# Patient Record
Sex: Male | Born: 1940 | Race: Black or African American | Hispanic: No | Marital: Married | Smoking: Never smoker
Health system: Southern US, Community
[De-identification: ages and names within clinical notes are randomized; demographics above are authoritative.]

## PROBLEM LIST (undated history)

## (undated) DIAGNOSIS — N401 Enlarged prostate with lower urinary tract symptoms: Secondary | ICD-10-CM

## (undated) DIAGNOSIS — E039 Hypothyroidism, unspecified: Secondary | ICD-10-CM

## (undated) DIAGNOSIS — N138 Other obstructive and reflux uropathy: Secondary | ICD-10-CM

## (undated) DIAGNOSIS — N390 Urinary tract infection, site not specified: Secondary | ICD-10-CM

## (undated) DIAGNOSIS — R7989 Other specified abnormal findings of blood chemistry: Secondary | ICD-10-CM

## (undated) DIAGNOSIS — E7800 Pure hypercholesterolemia, unspecified: Secondary | ICD-10-CM

## (undated) DIAGNOSIS — N529 Male erectile dysfunction, unspecified: Secondary | ICD-10-CM

## (undated) DIAGNOSIS — I10 Essential (primary) hypertension: Secondary | ICD-10-CM

## (undated) DIAGNOSIS — K649 Unspecified hemorrhoids: Secondary | ICD-10-CM

## (undated) HISTORY — DX: Other specified abnormal findings of blood chemistry: R79.89

## (undated) HISTORY — DX: Unspecified hemorrhoids: K64.9

## (undated) HISTORY — DX: Essential (primary) hypertension: I10

## (undated) HISTORY — DX: Male erectile dysfunction, unspecified: N52.9

## (undated) HISTORY — DX: Hypothyroidism, unspecified: E03.9

## (undated) HISTORY — DX: Other obstructive and reflux uropathy: N13.8

## (undated) HISTORY — DX: Pure hypercholesterolemia, unspecified: E78.00

## (undated) HISTORY — DX: Urinary tract infection, site not specified: N39.0

## (undated) HISTORY — PX: OTHER SURGICAL HISTORY: SHX169

## (undated) HISTORY — DX: Other obstructive and reflux uropathy: N40.1

---

## 2001-03-14 ENCOUNTER — Ambulatory Visit (HOSPITAL_COMMUNITY): Admission: RE | Admit: 2001-03-14 | Discharge: 2001-03-14 | Payer: Self-pay | Admitting: Gastroenterology

## 2006-10-13 ENCOUNTER — Encounter: Admission: RE | Admit: 2006-10-13 | Discharge: 2006-10-13 | Payer: Self-pay | Admitting: Internal Medicine

## 2008-10-03 ENCOUNTER — Encounter: Admission: RE | Admit: 2008-10-03 | Discharge: 2008-10-03 | Payer: Self-pay | Admitting: Internal Medicine

## 2009-01-31 ENCOUNTER — Encounter: Admission: RE | Admit: 2009-01-31 | Discharge: 2009-01-31 | Payer: Self-pay | Admitting: Internal Medicine

## 2010-05-07 ENCOUNTER — Emergency Department (HOSPITAL_BASED_OUTPATIENT_CLINIC_OR_DEPARTMENT_OTHER): Admission: EM | Admit: 2010-05-07 | Discharge: 2010-05-07 | Payer: Self-pay | Admitting: Emergency Medicine

## 2010-11-11 ENCOUNTER — Encounter: Payer: Self-pay | Admitting: Internal Medicine

## 2010-12-11 NOTE — Procedures (Signed)
[facility]  Patient:    Jake Clarke, Jake Clarke Visit Number: [id number] MRN: [id number]          Service Type: END Location: ENDO Attending Physician:  Louie Bun Proc. Date: 03/14/01 Adm. Date:  03/14/2001   CC:         Eric L. August Saucer, M.D.   Procedure Report  PROCEDURE:  Colonoscopy.  INDICATION FOR PROCEDURE:  Screening colonoscopy in a 70 year old patient with no previous colon screening.  DESCRIPTION OF PROCEDURE:  The patient was placed in the left lateral decubitus position and placed on the pulse monitor with continuous low-flow oxygen delivered by nasal cannula.  He was sedated with 60 mg IV Demerol and 6 mg IV Versed.  The Olympus video colonoscope was inserted into the rectum and advanced to the cecum, confirmed by transillumination at McBurneys point and visualization of the ileocecal valve and appendiceal orifice.  The prep was excellent.  The cecum, ascending, transverse, descending, and sigmoid colon all appeared normal with no masses, polyps, diverticula, or other mucosal abnormalities.  The rectum likewise appeared normal, and retroflex view of the anus revealed small internal hemorrhoids.  The colonoscope was then withdrawn, and the patient returned to the recovery room in stable condition.  He tolerated the procedure well, and there were no immediate complications.  IMPRESSION:  Internal hemorrhoids, otherwise normal colonoscopy. Attending Physician:  Louie Bun DD:  03/14/01 TD:  03/14/01 Job: [id number]

## 2012-02-29 LAB — BASIC METABOLIC PANEL
Creatinine: 1.2 mg/dL (ref 0.6–1.3)
Glucose: 107 mg/dL

## 2012-02-29 LAB — HEPATIC FUNCTION PANEL: ALT: 17 U/L (ref 10–40)

## 2012-05-04 ENCOUNTER — Encounter: Payer: Self-pay | Admitting: Family Medicine

## 2012-05-04 ENCOUNTER — Other Ambulatory Visit: Payer: Self-pay | Admitting: Family Medicine

## 2012-05-04 ENCOUNTER — Ambulatory Visit (INDEPENDENT_AMBULATORY_CARE_PROVIDER_SITE_OTHER): Payer: Medicare Other | Admitting: Family Medicine

## 2012-05-04 VITALS — BP 145/88 | HR 66 | Ht 72.0 in | Wt 211.0 lb

## 2012-05-04 DIAGNOSIS — N139 Obstructive and reflux uropathy, unspecified: Secondary | ICD-10-CM

## 2012-05-04 DIAGNOSIS — E039 Hypothyroidism, unspecified: Secondary | ICD-10-CM

## 2012-05-04 DIAGNOSIS — N138 Other obstructive and reflux uropathy: Secondary | ICD-10-CM

## 2012-05-04 DIAGNOSIS — Z23 Encounter for immunization: Secondary | ICD-10-CM | POA: Insufficient documentation

## 2012-05-04 DIAGNOSIS — E785 Hyperlipidemia, unspecified: Secondary | ICD-10-CM | POA: Insufficient documentation

## 2012-05-04 NOTE — Patient Instructions (Signed)
Benign Prostatic Hypertrophy  The prostate gland is part of the reproductive system of men. A normal prostate is about the size and shape of a walnut. The prostate gland makes a fluid that is mixed with sperm to make semen. This gland surrounds the urethra and is located in front of the rectum and just below the bladder. The bladder is where urine is stored. The urethra is the tube through which urine passes from the bladder to get out of the body. The prostate grows as a man ages. An enlarged prostate not caused by cancer is called benign prostatic hypertrophy (BPH). This is a common health problem in men over age 50. This condition is a normal part of aging. An enlarged prostate presses on the urethra. This makes it harder to pass urine. In the early stages of enlargement, the bladder can get by with a narrowed urethra by forcing the urine through. If the problem gets worse, medical or surgical treatment may be required.  This condition should be followed by your caregiver. Longstanding back pressure on the kidneys can cause infection. Back pressure and infection can progress to bladder damage and kidney (renal) failure. If needed, your caregiver may refer you to a specialist in kidney and prostate disease (urologist). CAUSES  The exact cause is not known.  SYMPTOMS   You are not able to completely empty your bladder.  Getting up often during the night to urinate.  Need to urinate frequently during the day.  Difficultly in starting urine flow.  Decrease in size and strength of the urine stream.  Dribbling after urination.  Pain on urination (more common with infection).  Inability to pass your water. This needs immediate treatment. DIAGNOSIS  These tests will help your caregiver understand your problem:  Digital rectal exam (DRE). In a rectal exam, your caregiver checks your prostate by putting a gloved, lubricated finger into the rectum to feel the back of your prostate gland. This exam  detects the size of the gland and abnormal lumps or growths.  Urinalysis (exam of the urine). This may include a culture if there is concern about infection.  Prostate Specific Antigen (PSA). This is a blood test used to screen for prostate cancer. It is not used alone for diagnosing prostate cancer.  Rectal ultrasound (sonogram). This test uses sound waves to electronically produce a "picture" of the prostate. It helps examine the prostate gland for cancer. TREATMENT  Mild symptoms may not need treatment. Simple observation and yearly exams may be all that is required. Medications and surgery are options for more severe problems. Your caregiver can help you make an informed decision for what is best. Two classes of medications are available for relief of prostate symptoms:  Medications that shrink the prostate. This helps relieve symptoms.  Uncommon side effects include problems with sexual function.  Medications to relax the muscle of the prostate. This also relieves the obstruction.  Side effects can include dizziness, fatigue, lightheadedness, and retrograde ejaculation (diminished volume of ejaculate). Several types of surgical treatments are available for relief of prostate symptoms:  Transurethral resection of the prostate (TURP). In this treatment, an instrument is inserted through opening at the tip of the penis. It is used to cut away pieces of the inner core of the prostate. The pieces are removed through the same opening of the penis. This removes the obstruction and helps get rid of the symptoms.  Transurethral incision (TUIP). In this procedure, small cuts are made in the prostate. This   lessens the prostates pressure on the urethra.  Transurethral microwave thermotherapy (TUMT). This procedure uses microwaves to create heat. The heat destroys and removes a small amount of prostate tissue.  Transurethral needle ablation (TUNA). This is a procedure that uses radio frequencies to  do the same as TUMT.  Interstitial laser coagulation (ILC). This is a procedure that uses a laser to do the same as TUMT and TUNA.  Transurethral electrovaporization (TUVP). This is a procedure that uses electrodes to do the same as the procedures listed above. Regardless of the method of treatment chosen, you and your caregiver will discuss the options. With this knowledge, you along with your caregiver can decide upon the best treatment for you. SEEK MEDICAL CARE IF:   You develop chills, fever of 100.5 F (38.1 C), or night sweats.  There is unexplained back pain.  Symptoms are not helped by medications prescribed.  You develop medication side effects.  Your urine becomes very dark or has a bad smell. SEEK IMMEDIATE MEDICAL CARE IF:   You are suddenly unable to urinate. This is an emergency. You should be seen immediately.  There are large amounts of blood or clots in the urine.  Your urinary problems become unmanageable.  You develop lightheadedness, severe dizziness, or you feel faint.  You develop moderate to severe low back or flank pain.  You develop chills or fever. Document Released: 07/12/2005 Document Revised: 10/04/2011 Document Reviewed: 04/03/2007 [employer] Patient Information 2013 [employer].  

## 2012-05-04 NOTE — Progress Notes (Signed)
  Subjective:    Patient ID: Jake Clarke, male    DOB: 14-Mar-1941, 71 y.o.   MRN: [id number]  HPI #1 patient's here to establish care. #2 hypothyroidism he is taking Synthroid 50 mcg for hypothyroidism #3 hypertension. He has a history of elevated blood pressure but since she's been exercising more active and walking his blood pressures come under control. Currently is not taking any medication for his elevated blood pressure. #4 prostatic hypertrophy with some obstruction. He is taking both Flomax and Cialis low-dose which is helped his prostatic hypertrophy but lately he has noticed starting to have nocturia again 2-3 times at night and would be best be described as being bladder instability during the day we feels that he has to go but unable to into his bladder well at the time. Unfortunately we do not have when his last PSA was done so we'll probably need to get some records from his doctors office. #5 immunization update. He had his pneumonia shot last year we will need a flu shot this year.  Review of Systems  All other systems reviewed and are negative.   BP 145/88  Pulse 66  Ht 6' (1.829 m)  Wt 211 lb (95.709 kg)  BMI 28.62 kg/m2  SpO2 96% No Known Allergies History   Social History  . Marital Status: Single    Spouse Name: N/A    Number of Children: N/A  . Years of Education: N/A   Occupational History  . Not on file.   Social History Main Topics  . Smoking status: Never Smoker   . Smokeless tobacco: Former Neurosurgeon  . Alcohol Use: No  . Drug Use: No  . Sexually Active: Yes   Other Topics Concern  . Not on file   Social History Narrative  . No narrative on file   Family History  Problem Relation Age of Onset  . Kidney disease Father   . Heart disease Sister    Past Medical History  Diagnosis Date  . Essential hypertension, malignant   . Pure hypercholesterolemia   . Other abnormal blood chemistry   . Unspecified hypothyroidism   . Impotence of organic  origin   . Urinary tract infection, site not specified   . Unspecified hemorrhoids without mention of complication   . Prostatic hypertrophy, benign, with obstruction    History reviewed. No pertinent past surgical history.     BP 145/88  Pulse 66  Ht 6' (1.829 m)  Wt 211 lb (95.709 kg)  BMI 28.62 kg/m2  SpO2 96% Objective:   Physical Exam  Constitutional: He is oriented to person, place, and time. He appears well-developed and well-nourished.  HENT:  Head: Normocephalic and atraumatic.  Neck: Normal range of motion.  Cardiovascular: Normal rate.   Neurological: He is alert and oriented to person, place, and time.  Psychiatric: He has a normal mood and affect. His behavior is normal.       Assessment & Plan:  #1 hypertension will continue to watch that blood pressure. At this time no new medication.  #2 hypothyroidism. Also we'll await to see lab work before we make any changes with that.  #3 prostatic hypertrophy. We will continue course with the Cialis and the Flomax. Once we have an idea of when his last prostate examination was done and PSA we'll go from there   #4 immunization update. We'll give flu shot today   Will have him return in 2 months for followup.

## 2012-06-15 ENCOUNTER — Encounter: Payer: Self-pay | Admitting: Family Medicine

## 2012-06-15 ENCOUNTER — Ambulatory Visit (INDEPENDENT_AMBULATORY_CARE_PROVIDER_SITE_OTHER): Payer: Medicare Other | Admitting: Family Medicine

## 2012-06-15 VITALS — BP 135/76 | HR 74 | Temp 98.0°F | Ht 72.0 in | Wt 213.0 lb

## 2012-06-15 DIAGNOSIS — N138 Other obstructive and reflux uropathy: Secondary | ICD-10-CM

## 2012-06-15 DIAGNOSIS — N139 Obstructive and reflux uropathy, unspecified: Secondary | ICD-10-CM

## 2012-06-15 DIAGNOSIS — N400 Enlarged prostate without lower urinary tract symptoms: Secondary | ICD-10-CM

## 2012-06-15 DIAGNOSIS — Z1211 Encounter for screening for malignant neoplasm of colon: Secondary | ICD-10-CM

## 2012-06-15 LAB — POCT URINALYSIS DIPSTICK
Bilirubin, UA: NEGATIVE
Blood, UA: NEGATIVE
Nitrite, UA: NEGATIVE
pH, UA: 6

## 2012-06-15 LAB — HEMOCCULT GUIAC POC 1CARD (OFFICE)

## 2012-06-15 MED ORDER — TADALAFIL 5 MG PO TABS: 5.0000 mg | ORAL_TABLET | Freq: Every day | ORAL | Status: DC

## 2012-06-15 MED ORDER — TAMSULOSIN HCL 0.4 MG PO CAPS: 0.4000 mg | ORAL_CAPSULE | Freq: Every day | ORAL | Status: DC

## 2012-06-15 MED ORDER — TADALAFIL 5 MG PO TABS: 30.0000 mg | ORAL_TABLET | Freq: Every day | ORAL | Status: DC

## 2012-06-15 MED ORDER — CIPROFLOXACIN HCL 500 MG PO TABS: 500.0000 mg | ORAL_TABLET | Freq: Two times a day (BID) | ORAL | Status: AC

## 2012-06-15 NOTE — Progress Notes (Signed)
Subjective:    Patient ID: Jake Clarke, male    DOB: 07-Jun-1941, 71 y.o.   MRN: [id number]  HPI #1 prostatic hypertrophy. Patient reports difficulty going to the bathroom frequency at night and feeling as if his stream is very weak. He normally takes Cialis and Flomax. This is been going on for about 3-4 weeks.  #2 ringing in his ears. He reports about 4 weeks ago having ringing in his ears so he stopped his Cialis but has noticed the ringing has continued. Worse at night.   #3 sexual dysfunction. Reason why he stopped the Cialis was that he felt the Cialis no longer help him sexually. Reports initially he was placed on Cialis it helped but lately it doesn't seem to work any more.  Review of Systems  Genitourinary: Positive for urgency, decreased urine volume and difficulty urinating.       Sexual dysfunction  All other systems reviewed and are negative.      BP 135/76  Pulse 74  Temp 98 F (36.7 C) (Oral)  Ht 6' (1.829 m)  Wt 213 lb (96.616 kg)  BMI 28.89 kg/m2 Objective:   Physical Exam  Vitals reviewed. Constitutional: He appears well-developed and well-nourished.  HENT:  Head: Normocephalic.  Right Ear: Hearing, tympanic membrane, external ear and ear canal normal.  Left Ear: Hearing, tympanic membrane, external ear and ear canal normal.  Eyes: Pupils are equal, round, and reactive to light.  Neck: Neck supple.  Genitourinary: Rectum normal and penis normal. Rectal exam shows no external hemorrhoid, no internal hemorrhoid, no fissure, no mass and no tenderness. Guaiac negative stool. Prostate is enlarged. Prostate is not tender. Circumcised.       Results for orders placed in visit on 06/15/12  POCT URINALYSIS DIPSTICK      Component Value Range   Color, UA yellow     Clarity, UA clear     Glucose, UA neg     Bilirubin, UA neg     Ketones, UA neg     Spec Grav, UA 1.020     Blood, UA neg     pH, UA 6.0     Protein, UA 30     Urobilinogen, UA 0.2     Nitrite, UA neg     Leukocytes, UA Negative     Assessment & Plan:  #1 prostatic enlargement. Have patient restart his Cialis 5 mg one tablet a day. Continue with his Flomax as well. Suggested to him that when he has a yearly exam in about 4 months recheck his prostate then also obtain a PSA when he has his blood work. Stressed to him that the Cialis and Flonase work by 2 different mechanisms to keep his prostate under control. While no signs of active infection was found I will place him on Cipro 500 mg twice a day for 2 weeks.  #2 sexual dysfunction. Explained to him that 5 mg Cialis is relatively low dose. I will give him a voucher for free 30 pills that he needs to get filled at a different pharmacy without using his insurance, and another 30 tablets sample pack given to patient as well. Went over several times that when he wants to become active sexually he didn't takes 2-3 tablets total of Cialis that day. On all other days he just takes one tablet 5 mg. The extra Cialis given to him we'll keep him from running out at the end of the month.  #3 ringing in his ears. Recommend  he listens to a radio low-volume at night to mask the ringing in his ears which I think is from peripheral vascular disease.

## 2012-06-15 NOTE — Patient Instructions (Signed)
Benign Prostatic Hypertrophy  The prostate gland is part of the reproductive system of men. A normal prostate is about the size and shape of a walnut. The prostate gland makes a fluid that is mixed with sperm to make semen. This gland surrounds the urethra and is located in front of the rectum and just below the bladder. The bladder is where urine is stored. The urethra is the tube through which urine passes from the bladder to get out of the body. The prostate grows as a man ages. An enlarged prostate not caused by cancer is called benign prostatic hypertrophy (BPH). This is a common health problem in men over age 50. This condition is a normal part of aging. An enlarged prostate presses on the urethra. This makes it harder to pass urine. In the early stages of enlargement, the bladder can get by with a narrowed urethra by forcing the urine through. If the problem gets worse, medical or surgical treatment may be required.  This condition should be followed by your caregiver. Longstanding back pressure on the kidneys can cause infection. Back pressure and infection can progress to bladder damage and kidney (renal) failure. If needed, your caregiver may refer you to a specialist in kidney and prostate disease (urologist). CAUSES  The exact cause is not known.  SYMPTOMS   You are not able to completely empty your bladder.  Getting up often during the night to urinate.  Need to urinate frequently during the day.  Difficultly in starting urine flow.  Decrease in size and strength of the urine stream.  Dribbling after urination.  Pain on urination (more common with infection).  Inability to pass your water. This needs immediate treatment. DIAGNOSIS  These tests will help your caregiver understand your problem:  Digital rectal exam (DRE). In a rectal exam, your caregiver checks your prostate by putting a gloved, lubricated finger into the rectum to feel the back of your prostate gland. This exam  detects the size of the gland and abnormal lumps or growths.  Urinalysis (exam of the urine). This may include a culture if there is concern about infection.  Prostate Specific Antigen (PSA). This is a blood test used to screen for prostate cancer. It is not used alone for diagnosing prostate cancer.  Rectal ultrasound (sonogram). This test uses sound waves to electronically produce a "picture" of the prostate. It helps examine the prostate gland for cancer. TREATMENT  Mild symptoms may not need treatment. Simple observation and yearly exams may be all that is required. Medications and surgery are options for more severe problems. Your caregiver can help you make an informed decision for what is best. Two classes of medications are available for relief of prostate symptoms:  Medications that shrink the prostate. This helps relieve symptoms.  Uncommon side effects include problems with sexual function.  Medications to relax the muscle of the prostate. This also relieves the obstruction.  Side effects can include dizziness, fatigue, lightheadedness, and retrograde ejaculation (diminished volume of ejaculate). Several types of surgical treatments are available for relief of prostate symptoms:  Transurethral resection of the prostate (TURP). In this treatment, an instrument is inserted through opening at the tip of the penis. It is used to cut away pieces of the inner core of the prostate. The pieces are removed through the same opening of the penis. This removes the obstruction and helps get rid of the symptoms.  Transurethral incision (TUIP). In this procedure, small cuts are made in the prostate. This   lessens the prostates pressure on the urethra.  Transurethral microwave thermotherapy (TUMT). This procedure uses microwaves to create heat. The heat destroys and removes a small amount of prostate tissue.  Transurethral needle ablation (TUNA). This is a procedure that uses radio frequencies to  do the same as TUMT.  Interstitial laser coagulation (ILC). This is a procedure that uses a laser to do the same as TUMT and TUNA.  Transurethral electrovaporization (TUVP). This is a procedure that uses electrodes to do the same as the procedures listed above. Regardless of the method of treatment chosen, you and your caregiver will discuss the options. With this knowledge, you along with your caregiver can decide upon the best treatment for you. SEEK MEDICAL CARE IF:   You develop chills, fever of 100.5 F (38.1 C), or night sweats.  There is unexplained back pain.  Symptoms are not helped by medications prescribed.  You develop medication side effects.  Your urine becomes very dark or has a bad smell. SEEK IMMEDIATE MEDICAL CARE IF:   You are suddenly unable to urinate. This is an emergency. You should be seen immediately.  There are large amounts of blood or clots in the urine.  Your urinary problems become unmanageable.  You develop lightheadedness, severe dizziness, or you feel faint.  You develop moderate to severe low back or flank pain.  You develop chills or fever. Document Released: 07/12/2005 Document Revised: 10/04/2011 Document Reviewed: 04/03/2007 [facility] Patient Information 2013 [place], [place]. Tinnitus Sounds you hear in your ears and coming from within the ear is called tinnitus. This can be a symptom of many ear disorders. It is often associated with hearing loss.  Tinnitus can be seen with:  Infections.  Ear blockages such as wax buildup.  Meniere's disease.  Ear damage.  Inherited.  Occupational causes. While irritating, it is not usually a threat to health. When the cause of the tinnitus is wax, infection in the middle ear, or foreign body it is easily treated. Hearing loss will usually be reversible.  TREATMENT  When treating the underlying cause does not get rid of tinnitus, it may be necessary to get rid of the unwanted sound by covering it  up with more pleasant background noises. This may include music, the radio etc. There are tinnitus maskers which can be worn which produce background noise to cover up the tinnitus. Avoid all medications which tend to make tinnitus worse such as alcohol, caffeine, aspirin, and nicotine. There are many soothing background tapes such as rain, ocean, thunderstorms, etc. These soothing sounds help with sleeping or resting. Keep all follow-up appointments and referrals. This is important to identify the cause of the problem. It also helps avoid complications, impaired hearing, disability, or chronic pain. Document Released: 07/12/2005 Document Revised: 10/04/2011 Document Reviewed: 02/28/2008 [facility] Patient Information 2013 [place], [place].

## 2012-06-23 ENCOUNTER — Other Ambulatory Visit: Payer: Self-pay | Admitting: Family Medicine

## 2012-06-26 ENCOUNTER — Telehealth: Payer: Self-pay | Admitting: Family Medicine

## 2012-06-26 ENCOUNTER — Other Ambulatory Visit: Payer: Self-pay | Admitting: Family Medicine

## 2012-06-26 DIAGNOSIS — E785 Hyperlipidemia, unspecified: Secondary | ICD-10-CM

## 2012-06-26 NOTE — Telephone Encounter (Signed)
See refill note from today. 

## 2012-06-26 NOTE — Telephone Encounter (Signed)
Sue Lush, Will you please let Jake Clarke know that I sent in a 30 day supply of his atorvastatin/Lipitor, I don't see any record of a recent lipid panel and would like to have that checked before providing long term refills.  I'd like him to be fasting for this, I'll place a lab order up front for him to do this at his convenience.  There was a request for azithromycin as well but antibiotic refills require an office visit for an evaluation.  Thank you.

## 2012-06-26 NOTE — Telephone Encounter (Signed)
Left detailed message on vm.

## 2012-07-04 ENCOUNTER — Ambulatory Visit: Payer: Medicare Other | Admitting: Family Medicine

## 2012-07-25 ENCOUNTER — Other Ambulatory Visit: Payer: Self-pay | Admitting: *Deleted

## 2012-07-25 MED ORDER — TADALAFIL 5 MG PO TABS: 5.0000 mg | ORAL_TABLET | Freq: Every day | ORAL | Status: DC

## 2012-08-09 ENCOUNTER — Encounter: Payer: Self-pay | Admitting: Family Medicine

## 2012-08-09 ENCOUNTER — Ambulatory Visit (INDEPENDENT_AMBULATORY_CARE_PROVIDER_SITE_OTHER): Payer: Medicare Other | Admitting: Family Medicine

## 2012-08-09 VITALS — BP 128/75 | HR 61 | Temp 98.0°F | Ht 72.0 in | Wt 211.0 lb

## 2012-08-09 DIAGNOSIS — R3589 Other polyuria: Secondary | ICD-10-CM | POA: Insufficient documentation

## 2012-08-09 DIAGNOSIS — N138 Other obstructive and reflux uropathy: Secondary | ICD-10-CM

## 2012-08-09 DIAGNOSIS — E039 Hypothyroidism, unspecified: Secondary | ICD-10-CM

## 2012-08-09 DIAGNOSIS — N529 Male erectile dysfunction, unspecified: Secondary | ICD-10-CM | POA: Insufficient documentation

## 2012-08-09 DIAGNOSIS — E785 Hyperlipidemia, unspecified: Secondary | ICD-10-CM

## 2012-08-09 DIAGNOSIS — R358 Other polyuria: Secondary | ICD-10-CM

## 2012-08-09 DIAGNOSIS — N401 Enlarged prostate with lower urinary tract symptoms: Secondary | ICD-10-CM

## 2012-08-09 DIAGNOSIS — N139 Obstructive and reflux uropathy, unspecified: Secondary | ICD-10-CM

## 2012-08-09 NOTE — Progress Notes (Signed)
CC: Jake Clarke is a 72 y.o. male is here for Benign Prostatic Hypertrophy and lab work   Subjective: HPI: Patient presents for Medicare wellness exam, scheduling error only allow him 15 minute visit. 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 During the day has urgency and hesitancy. Has had infrequent accidents any urinary sense. Describes symptoms overall as moderate in severity. No worse than before starting daily Cialis. Denies polyphagia polydipsia poorly healing wounds, vision loss, nor motor or sensory disturbances. Denies unintentional weight loss nor family history of prostate cancer. He believes former providers have told him his PSA was mildly elevated. Symptoms above can be present any hour of the day. Restricted water intake improves symptoms, increasing fluid intake worsens  Hypothyroidism: He believes it's been over a year since TSH was checked. Denies missed levothyroxin doses. Denies tremor, anxiety, depression, weight gain or loss, constipation nor diarrhea, nor skin changes  Hyperlipidemia: Currently taking Lipitor daily without missed doses, watches what he, exercises regularly with walking and exercise equipment. Denies right upper quadrant pain, skin or scleral discoloration, nor myalgias  Erectile dysfunction: Complains of inability to initiate and maintain erection in less using Cialis. This is been present for years. It is not getting better or worse, described as moderate in severity. There within a makes worse as not having Cialis available. Denies chest pain, irregular heartbeat, limb claudication, testicular pain/swelling/masses, nor penile discharge   Review Of Systems Outlined In HPI  Past Medical History  Diagnosis Date  . Essential hypertension, malignant   . Pure hypercholesterolemia   . Other abnormal blood chemistry   . Unspecified hypothyroidism   .  Impotence of organic origin   . Urinary tract infection, site not specified   . Unspecified hemorrhoids without mention of complication   . Prostatic hypertrophy, benign, with obstruction      Family History  Problem Relation Age of Onset  . Kidney disease Father   . Heart disease Sister      History  Substance Use Topics  . Smoking status: Never Smoker   . Smokeless tobacco: Former Neurosurgeon  . Alcohol Use: No     Objective: Filed Vitals:   08/09/12 1119  BP: 128/75  Pulse: 61  Temp: 98 F (36.7 C)    General: Alert and Oriented, No Acute Distress HEENT: Pupils equal, round, reactive to light. Conjunctivae clear.  Moist mucous membranes, pharynx without inflammation nor lesions.  Neck supple without palpable lymphadenopathy nor abnormal masses. Nonpalpable thyroid Lungs: Clear to auscultation bilaterally, no wheezing/ronchi/rales.  Comfortable work of breathing. Good air movement. No carotid bruit Cardiac: Regular rate and rhythm. Normal S1/S2.  No murmurs, rubs, nor gallops.   Extremities: No peripheral edema.  Strong peripheral pulses.  Mental Status: No depression, anxiety, nor agitation. Skin: Warm and dry.  Assessment & Plan: Jake Clarke was seen today for benign prostatic hypertrophy and lab work.  Diagnoses and associated orders for this visit:  Hyperlipidemia - Lipid panel  Hypothyroid - TSH  Prostate hyperplasia with urinary obstruction - PSA, Medicare  Erectile dysfunction - Testosterone, free, total  Polyuria - Urinalysis, Routine w reflex microscopic    Hyperlipidemia: Clinically stabl,Overdue for lipid panel will titrate Lipitor as needed Hypothyroidism: Clinically stable: Overdue for TSH and will adjust levothyroxine as needed Prostate hyperplasia with obstruction: Due for PSA, we'll discuss possible use of finasteride if within normal limits, urology  referral if elevated Rectal dysfunction: Sample of Cialis given today, we'll check testosterone for  any possibility of future replacement therapy Polyuria: Likely due to the prostate hypertrophy, will rule out glucose in the urine  Return in about 1 week (around 08/16/2012) for cpe.

## 2012-08-10 ENCOUNTER — Telehealth: Payer: Self-pay | Admitting: Family Medicine

## 2012-08-10 DIAGNOSIS — E785 Hyperlipidemia, unspecified: Secondary | ICD-10-CM

## 2012-08-10 LAB — LIPID PANEL: HDL: 48 mg/dL (ref >39)

## 2012-08-10 LAB — TESTOSTERONE, FREE, TOTAL, SHBG
Sex Hormone Binding: 47 nmol/L (ref 13–71)
Testosterone, Free: 74.6 pg/mL (ref 47.0–244.0)
Testosterone-% Free: 1.6 % (ref 1.6–2.9)
Testosterone: 453.49 ng/dL (ref 300–890)

## 2012-08-10 LAB — URINALYSIS, ROUTINE W REFLEX MICROSCOPIC
Hgb urine dipstick: NEGATIVE
Ketones, ur: NEGATIVE mg/dL
Nitrite: NEGATIVE
Protein, ur: 30 mg/dL — AB
Urobilinogen, UA: 0.2 mg/dL (ref 0.0–1.0)

## 2012-08-10 LAB — PSA, MEDICARE: PSA: 3.83 ng/mL (ref <=4.00)

## 2012-08-10 LAB — URINALYSIS, MICROSCOPIC ONLY: Bacteria, UA: NONE SEEN

## 2012-08-10 LAB — TSH: TSH: 2.169 u[IU]/mL (ref 0.350–4.500)

## 2012-08-10 NOTE — Telephone Encounter (Signed)
Pt notified and voiced understanding 

## 2012-08-10 NOTE — Telephone Encounter (Signed)
Sue Lush, Will you please let Jake Clarke know that there were no alarming values from his blood work yesterday.  I'll go over the specifics when I see him next week at his CPE.  His testosterone level was normal.  His prostate screening test was not elevated which is good news because he is a canididate for starting a medication called finasteride to help with his enlarged prostate symptoms.  Just wanted to give him the name and a chance to do some reading about it online before he sees me next week.  Thank you.

## 2012-08-16 ENCOUNTER — Encounter: Payer: Medicare Other | Admitting: Family Medicine

## 2012-08-17 ENCOUNTER — Encounter: Payer: Self-pay | Admitting: Family Medicine

## 2012-08-17 ENCOUNTER — Ambulatory Visit (INDEPENDENT_AMBULATORY_CARE_PROVIDER_SITE_OTHER): Payer: Medicare Other | Admitting: Family Medicine

## 2012-08-17 VITALS — BP 158/81 | HR 67 | Ht 72.0 in | Wt 213.0 lb

## 2012-08-17 DIAGNOSIS — E785 Hyperlipidemia, unspecified: Secondary | ICD-10-CM

## 2012-08-17 DIAGNOSIS — Z0000 Encounter for general adult medical examination without abnormal findings: Secondary | ICD-10-CM

## 2012-08-17 DIAGNOSIS — N138 Other obstructive and reflux uropathy: Secondary | ICD-10-CM

## 2012-08-17 DIAGNOSIS — E039 Hypothyroidism, unspecified: Secondary | ICD-10-CM

## 2012-08-17 DIAGNOSIS — Z23 Encounter for immunization: Secondary | ICD-10-CM

## 2012-08-17 MED ORDER — TADALAFIL 5 MG PO TABS: ORAL_TABLET | ORAL | Status: DC

## 2012-08-17 MED ORDER — ZOSTER VACCINE LIVE 19400 UNT/0.65ML ~~LOC~~ SOLR: 0.6500 mL | Freq: Once | SUBCUTANEOUS | Status: DC

## 2012-08-17 MED ORDER — ATORVASTATIN CALCIUM 10 MG PO TABS: ORAL_TABLET | ORAL | Status: DC

## 2012-08-17 NOTE — Progress Notes (Addendum)
Subjective:    Jake Clarke is a 72 y.o. male who presents for Medicare Annual/Subsequent preventive examination.   Preventive Screening-Counseling & Management  Tobacco History  Smoking status  . Never Smoker   Smokeless tobacco  . Former Neurosurgeon    Problems Prior to Visit 1. BPH, HLD, ED  Current Problems (verified) Patient Active Problem List  Diagnosis  . Hyperlipidemia LDL goal < 130  . Prostate hyperplasia with urinary obstruction  . Hypothyroid  . Immunization due  . Erectile dysfunction  . Polyuria    Medications Prior to Visit Current Outpatient Prescriptions on File Prior to Visit  Medication Sig Dispense Refill  . atorvastatin (LIPITOR) 10 MG tablet Take one on Monday, Wednesday, and Friday  30 tablet  3  . Calcium Carbonate (CALTRATE 600 PO) Take 600 mg by mouth.        . levothyroxine (SYNTHROID, LEVOTHROID) 50 MCG tablet Take 50 mcg by mouth daily.      . Multiple Vitamins-Minerals (CENTRUM SILVER PO) Take by mouth.        . Tamsulosin HCl (FLOMAX) 0.4 MG CAPS Take 1 capsule (0.4 mg total) by mouth daily.  30 capsule  6    Current Medications (verified) Current Outpatient Prescriptions  Medication Sig Dispense Refill  . atorvastatin (LIPITOR) 10 MG tablet Take one on Monday, Wednesday, and Friday  30 tablet  3  . Calcium Carbonate (CALTRATE 600 PO) Take 600 mg by mouth.        . levothyroxine (SYNTHROID, LEVOTHROID) 50 MCG tablet Take 50 mcg by mouth daily.      . Multiple Vitamins-Minerals (CENTRUM SILVER PO) Take by mouth.        . tadalafil (CIALIS) 5 MG tablet Take one by mouth daily for (symptomatic) BPH with urinary obstruction. (Dx: 600.91, 599.69)  30 tablet  4  . Tamsulosin HCl (FLOMAX) 0.4 MG CAPS Take 1 capsule (0.4 mg total) by mouth daily.  30 capsule  6  . zoster vaccine live, PF, (ZOSTAVAX) 16109 UNT/0.65ML injection Inject 19,400 Units into the skin once.  1 each  0     Allergies (verified) Review of patient's allergies indicates no  known allergies.   PAST HISTORY  Family History Family History  Problem Relation Age of Onset  . Kidney disease Father   . Heart disease Sister     Social History History  Substance Use Topics  . Smoking status: Never Smoker   . Smokeless tobacco: Former Neurosurgeon  . Alcohol Use: No    Are there smokers in your home (other than you)?  No  Risk Factors Current exercise habits: Home exercise routine includes walking 12 miles a week, occasional treadmill.  Dietary issues discussed: low salt, low fat, high fiber   Cardiac risk factors: advanced age (older than 30 for men, 28 for women), dyslipidemia and male gender.  Depression Screen (Note: if answer to either of the following is "Yes", a more complete depression screening is indicated)   Q1: Over the past two weeks, have you felt down, depressed or hopeless? No  Q2: Over the past two weeks, have you felt little interest or pleasure in doing things? No  Have you lost interest or pleasure in daily life? No  Do you often feel hopeless? No  Do you cry easily over simple problems? No  Activities of Daily Living In your present state of health, do you have any difficulty performing the following activities?:  Driving? No Managing money?  No Feeding yourself?  No Getting from bed to chair? No Climbing a flight of stairs? No Preparing food and eating?: No Bathing or showering? No Getting dressed: No Getting to the toilet? No Using the toilet:No Moving around from place to place: No In the past year have you fallen or had a near fall?:No   Are you sexually active?  Yes  Do you have more than one partner?  No  Hearing Difficulties: No Do you often ask people to speak up or repeat themselves? No Do you experience ringing or noises in your ears? No Do you have difficulty understanding soft or whispered voices? No   Do you feel that you have a problem with memory? No  Do you often misplace items? No  Do you feel safe at home?   Yes  Cognitive Testing  Alert? Yes  Normal Appearance?Yes  Oriented to person? Yes  Place? Yes   Time? Yes  Recall of three objects?  Yes  Can perform simple calculations? Yes  Displays appropriate judgment?Yes  Can read the correct time from a watch face?Yes   Advanced Directives have been discussed with the patient? Yes   List the Names of Other Physician/Practitioners you currently use: 1.  Dr. [facility]  Indicate any recent Medical Services you may have received from other than [facility] providers in the past year (date may be approximate).  Immunization History  Administered Date(s) Administered  . Influenza Split 05/04/2012  . Pneumococcal-Generic 07/27/2011    Screening Tests Health Maintenance  Topic Date Due  . Tetanus/tdap  03/09/1958  . Colonoscopy  03/09/1989  . Zostavax  03/10/1999  . Pneumococcal Polysaccharide Vaccine Age 81 And Over  03/09/2004  . Influenza Vaccine  03/26/2013    All answers were reviewed with the patient and necessary referrals were made:  Laren Boom, DO   08/17/2012   History reviewed: allergies, current medications, past family history, past medical history, past social history, past surgical history and problem list  Review of Systems Review of Systems - General ROS: negative for - chills, fever, night sweats, weight gain or weight loss Ophthalmic ROS: negative for - decreased vision Psychological ROS: negative for - anxiety or depression ENT ROS: negative for - hearing change, nasal congestion, tinnitus or allergies Hematological and Lymphatic ROS: negative for - bleeding problems, bruising or swollen lymph nodes Breast ROS: negative Respiratory ROS: no cough, shortness of breath, or wheezing Cardiovascular ROS: no chest pain or dyspnea on exertion Gastrointestinal ROS: no abdominal pain, change in bowel habits, or black or bloody stools Genito-Urinary ROS: negative for - genital discharge, genital ulcers, incontinence or abnormal  bleeding from genitals Musculoskeletal ROS: negative for - joint pain or muscle pain Neurological ROS: negative for - headaches or memory loss Dermatological ROS: negative for lumps, mole changes, rash and skin lesion changes   Objective:     Vision by Snellen chart: right NFA:OZHYQM to measure, left eye:20/25 Blood pressure 158/81, pulse 67, height 6' (1.829 m), weight 213 lb (96.616 kg). Body mass index is 28.89 kg/(m^2).  General: No Acute Distress HEENT: Atraumatic, normocephalic, conjunctivae normal without scleral icterus.  No nasal discharge, hearing grossly intact, TMs with good landmarks bilaterally with no middle ear abnormalities, posterior pharynx clear without oral lesions. Neck: Supple, trachea midline, no cervical nor supraclavicular adenopathy. Pulmonary: Clear to auscultation bilaterally without wheezing, rhonchi, nor rales. Cardiac: Regular rate and rhythm.  No murmurs, rubs, nor gallops. No peripheral edema.  2+ peripheral pulses bilaterally. Abdomen: Bowel sounds normal.  No  masses.  Non-tender without rebound.  Negative Murphy's sign. GU: Penis without lesions.  Bilateral descended non-tender testicles without palpable abnormal masses. MSK: Grossly intact, no signs of weakness.  Full strength throughout upper and lower extremities.  Full ROM in upper and lower extremities.  No midline spinal tenderness. Neuro: Gait unremarkable, CN II-XII grossly intact.  C5-C6 Reflex 2/4 Bilaterally, L4 Reflex 2/4 Bilaterally.  Cerebellar function intact. Skin: No rashes. Psych: Alert and oriented to person/place/time.  Thought process normal. No anxiety/depression.     Assessment:     72 year old male with uncontrolled BPH with lower urinary tract symptoms , not at goal hyperlipidemia. Questionable up-to-date status on colonoscopy but otherwise up-to-date on health maintenance     Plan:     During the course of the visit the patient was educated and counseled about appropriate  screening and preventive services including:    Pneumococcal vaccine   Influenza vaccine  Td vaccine  Prostate cancer screening  Colorectal cancer screening  Diabetes screening  Glaucoma screening  Advanced directives: patient to discuss with wife  Diet review for nutrition referral? Not indicated  Patient Instructions (the written plan) was given to the patient.  Medicare Attestation I have personally reviewed: The patient's medical and social history Their use of alcohol, tobacco or illicit drugs Their current medications and supplements The patient's functional ability including ADLs,fall risks, home safety risks, cognitive, and hearing and visual impairment Diet and physical activities Evidence for depression or mood disorders  The patient's weight, height, BMI, and visual acuity have been recorded in the chart.  I have made referrals, counseling, and provided education to the patient based on review of the above and I have provided the patient with a written personalized care plan for preventive services.   Colonoscopy records reviewed, due for 5 year colonoscopy in Feb of this year. Will update patient Prescriptions Cialis provided primarily for BPH with symptoms. Patient was only taking Lipitor 2 times a week, he will go to Monday Wednesday Friday given being just above goal LDL less than 130   Evvie Behrmann, Gregary Signs, DO   08/17/2012

## 2012-08-17 NOTE — Patient Instructions (Addendum)
Requesting records from DR. Madilyn Fireman for colonoscopy status. Given Rx for Zostavax  Dr. Genelle Bal General Advice Following Your Complete Physical Exam  The Benefits of Regular Exercise: Unless you suffer from an uncontrolled cardiovascular condition, studies strongly suggest that regular exercise and physical activity will add to both the quality and length of your life.  The World Health Organization recommends 150 minutes of moderate intensity aerobic activity every week.  This is best split over 3-4 days a week, and can be as simple as a brisk walk for just over 35 minutes "most days of the week".  This type of exercise has been shown to lower LDL-Cholesterol, lower average blood sugars, lower blood pressure, lower cardiovascular disease risk, improve memory, and increase one's overall sense of wellbeing.  The addition of anaerobic (or "strength training") exercises offers additional benefits including but not limited to increased metabolism, prevention of osteoporosis, and improved overall cholesterol levels.  How Can I Strive For A Low-Fat Diet?: Current guidelines recommend that 25-35 percent of your daily energy (food) intake should come from fats.  One might ask how can this be achieved without having to dissect each meal on a daily basis?  Switch to skim or 1% milk instead of whole milk.  Focus on lean meats such as ground [place], fresh fish, baked chicken, and lean cuts of beef as your source of dietary protein.  Consume less than 300mg /day of dietary cholesterol.  Limit trans fatty acid consumption primarily by limiting synthetic trans fats such as partially hydrogenated oils (Ex: fried fast foods).  Focus efforts on reducing your intake of "solid" fats (Ex: Butter).  Substitute olive or vegetable oil for solid fats where possible.  Moderation of Salt Intake: Provided you don't carry a diagnosis of congestive heart failure nor renal failure, I recommend a daily allowance of no more than  2300 mg of salt (sodium).  Keeping under this daily goal is associated with a decreased risk of cardiovascular events, creeping above it can lead to elevated blood pressures and increases your risk of cardiovascular events.  Milligrams (mg) of salt is listed on all nutrition labels, and your daily intake can add up faster than you think.  Most canned and frozen dinners can pack in over half your daily salt allowance in one meal.    Lifestyle Health Risks: Certain lifestyle choices carry specific health risks.  As you may already know, tobacco use has been associated with increasing one's risk of cardiovascular disease, pulmonary disease, numerous cancers, among many other issues.  What you may not know is that there are medications and nicotine replacement strategies that can more than double your chances of successfully quitting.  I would be thrilled to help manage your quitting strategy if you currently use tobacco products.  When it comes to alcohol use, I've yet to find an "ideal" daily allowance.  Provided an individual does not have a medical condition that is exacerbated by alcohol consumption, general guidelines determine "safe drinking" as no more than two standard drinks for a man or no more than one standard drink for a male per day.  However, much debate still exists on whether any amount of alcohol consumption is technically "safe".  My general advice, keep alcohol consumption to a minimum for general health promotion.  If you or others believe that alcohol, tobacco, or recreational drug use is interfering with your life, I would be happy to provide confidential counseling regarding treatment options.  General "Over The Counter" Nutrition Advice: Postmenopausal women  should aim for a daily calcium intake of 1200 mg, however a significant portion of this might already be provided by diets including milk, yogurt, cheese, and other dairy products.  Vitamin D has been shown to help preserve bone  density, prevent fatigue, and has even been shown to help reduce falls in the elderly.  Ensuring a daily intake of 800 Units of Vitamin D is a good place to start to enjoy the above benefits, we can easily check your Vitamin D level to see if you'd potentially benefit from supplementation beyond 800 Units a day.  Folic Acid intake should be of particular concern to women of childbearing age.  Daily consumption of 400-800 mcg of Folic Acid is recommended to minimize the chance of spinal cord defects in a fetus should pregnancy occur.    For many adults, accidents still remain one of the most common culprits when it comes to cause of death.  Some of the simplest but most effective preventitive habits you can adopt include regular seatbelt use, proper helmet use, securing firearms, and regularly testing your smoke and carbon monoxide detectors.  Jake Clarke B. [facility] DO Med [facility] [address] [place], [place] [address] Phone: [phone]

## 2012-08-21 ENCOUNTER — Encounter: Payer: Self-pay | Admitting: Family Medicine

## 2012-08-21 DIAGNOSIS — I10 Essential (primary) hypertension: Secondary | ICD-10-CM | POA: Insufficient documentation

## 2012-08-22 ENCOUNTER — Encounter: Payer: Self-pay | Admitting: *Deleted

## 2012-08-24 ENCOUNTER — Encounter: Payer: Self-pay | Admitting: *Deleted

## 2012-09-04 ENCOUNTER — Ambulatory Visit (INDEPENDENT_AMBULATORY_CARE_PROVIDER_SITE_OTHER): Payer: Medicare Other | Admitting: Family Medicine

## 2012-09-04 ENCOUNTER — Ambulatory Visit: Payer: Medicare Other

## 2012-09-04 ENCOUNTER — Encounter: Payer: Self-pay | Admitting: Family Medicine

## 2012-09-04 ENCOUNTER — Ambulatory Visit (HOSPITAL_BASED_OUTPATIENT_CLINIC_OR_DEPARTMENT_OTHER)
Admission: RE | Admit: 2012-09-04 | Discharge: 2012-09-04 | Disposition: A | Discharge status: Home / Self Care | Payer: Medicare Other | Source: Ambulatory Visit | Attending: Family Medicine | Admitting: Family Medicine

## 2012-09-04 VITALS — BP 140/78 | HR 72 | Wt 213.0 lb

## 2012-09-04 DIAGNOSIS — M25562 Pain in left knee: Secondary | ICD-10-CM

## 2012-09-04 DIAGNOSIS — M25569 Pain in unspecified knee: Secondary | ICD-10-CM

## 2012-09-04 MED ORDER — DICLOFENAC SODIUM 50 MG PO TBEC: DELAYED_RELEASE_TABLET | ORAL | Status: DC

## 2012-09-04 NOTE — Progress Notes (Signed)
CC: Jake Clarke is a 72 y.o. male is here for Left knee pain   Subjective: HPI:  Complains of left knee pain. This came on gradually Thursday morning. He has been present ever since, worse with activity such as walking. Improves and is almost absent at rest. Pain was moderate in severity late last week but has now improved to a mild. Pain is localized deep in the joint and somewhat at the back of the knee. He denies catching, locking, giving way, nor restricted range of motion. He denies swelling, redness, nor warmth of the overlying skin. Denies over exertion or trauma. Was able to continue with his walking regimen last week although was quite painful. He denies weakness in the left lower extremity. Denies motor or sensory disturbances in the left lower extremity. He has never had pain in this joint before. No interventions as of yet.  Denies fevers, chills, balance problems, nor peripheral edema. Denies limb claudication nor limb rest pain   Review Of Systems Outlined In HPI  Past Medical History  Diagnosis Date  . Essential hypertension, malignant   . Pure hypercholesterolemia   . Other abnormal blood chemistry   . Unspecified hypothyroidism   . Impotence of organic origin   . Urinary tract infection, site not specified   . Unspecified hemorrhoids without mention of complication   . Prostatic hypertrophy, benign, with obstruction      Family History  Problem Relation Age of Onset  . Kidney disease Father   . Heart disease Sister      History  Substance Use Topics  . Smoking status: Never Smoker   . Smokeless tobacco: Former Neurosurgeon  . Alcohol Use: No     Objective: Filed Vitals:   09/04/12 1308  BP: 140/78  Pulse: 72    General: Alert and Oriented, No Acute Distress HEENT: Pupils equal, round, reactive to light. Conjunctivae clear.  Moist mucous membranes Lungs: Clear comfortable work of breathing Cardiac: Regular rate and rhythm.  Extremities: No peripheral  edema.  Strong peripheral pulses. Left lower extremity with negative valgus and varus pain/laxity, McMurray's negative, anterior drawer negative, no swelling or redness of the left knee joint. Pain is not reproduced with palpation of the patella nor joint line. No patellar apprehension, no crepitus with passive movement of the left knee. Mental Status: No depression, anxiety, nor agitation. Skin: Warm and dry.  Assessment & Plan: Jake Clarke was seen today for left knee pain.  Diagnoses and associated orders for this visit:  Left knee pain - DG Knee 1-2 Views Left; Future - diclofenac (VOLTAREN) 50 MG EC tablet; Take one tablet every 8 hours only as needed for pain, take with small snack.    Personal interpretation revealing mild joint space narrowing and mild degenerative changes on the lateral femur. No fracture. Discussed aspiration and steroid injection versus conservative outpatient therapy with nonsteroidal anti-inflammatory area patient would prefer trying diclofenac first and returning if not improved in a week. Discussed the pain is most likely secondary to osteoarthritis with possibility of a Baker's cyst  Return if symptoms worsen or fail to improve.

## 2012-09-13 ENCOUNTER — Ambulatory Visit: Payer: Medicare Other | Admitting: Family Medicine

## 2012-10-11 ENCOUNTER — Ambulatory Visit: Payer: Medicare Other | Admitting: Family Medicine

## 2012-10-13 ENCOUNTER — Ambulatory Visit: Payer: Medicare Other | Admitting: Family Medicine

## 2013-03-28 ENCOUNTER — Encounter: Payer: Self-pay | Admitting: Family Medicine

## 2013-03-28 ENCOUNTER — Ambulatory Visit (INDEPENDENT_AMBULATORY_CARE_PROVIDER_SITE_OTHER): Payer: Medicare Other | Admitting: Family Medicine

## 2013-03-28 VITALS — BP 149/87 | HR 70 | Wt 210.0 lb

## 2013-03-28 DIAGNOSIS — I729 Aneurysm of unspecified site: Secondary | ICD-10-CM

## 2013-03-28 DIAGNOSIS — R350 Frequency of micturition: Secondary | ICD-10-CM

## 2013-03-28 DIAGNOSIS — N138 Other obstructive and reflux uropathy: Secondary | ICD-10-CM

## 2013-03-28 DIAGNOSIS — N139 Obstructive and reflux uropathy, unspecified: Secondary | ICD-10-CM

## 2013-03-28 DIAGNOSIS — N529 Male erectile dysfunction, unspecified: Secondary | ICD-10-CM

## 2013-03-28 MED ORDER — TADALAFIL 20 MG PO TABS: 20.0000 mg | ORAL_TABLET | Freq: Every day | ORAL | Status: DC | PRN

## 2013-03-28 NOTE — Progress Notes (Signed)
CC: Jake Clarke is a 72 y.o. male is here for left wrist pain   Subjective: HPI:  Patient complains of worsening erectile dysfunction citing having trouble with initiating and maintaining erection however never the same time. Symptoms are moderate in severity however they used to be mild, he is taking Cialis 5 mg on a daily basis. Nothing seems to make symptoms better or worse. They've been worsening over the past month. He continues to walk 12 miles a week walking most days of the week denies exertional chest pain shortness of breath nor limb claudication. Denies penile discharge testicular pain nor dysuria. Patient reports he is waking 1-2 times a night to urinate denies incomplete voiding sensation more weak stream.  Patient complains of a mass in his right hand that has been enlarging over the past one to 2 years seems to be enlarging faster over the last 1-3 months. It is pulsatile and nontender he denies any recent or remote trauma to this region, he localizes the abnormality at the base of the thumb. Denies a history of known aneurysms.     Review Of Systems Outlined In HPI  Past Medical History  Diagnosis Date  . Essential hypertension, malignant   . Pure hypercholesterolemia   . Other abnormal blood chemistry   . Unspecified hypothyroidism   . Impotence of organic origin   . Urinary tract infection, site not specified   . Unspecified hemorrhoids without mention of complication   . Prostatic hypertrophy, benign, with obstruction      Family History  Problem Relation Age of Onset  . Kidney disease Father   . Heart disease Sister      History  Substance Use Topics  . Smoking status: Never Smoker   . Smokeless tobacco: Former Neurosurgeon  . Alcohol Use: No     Objective: Filed Vitals:   03/28/13 1400  BP: 149/87  Pulse: 70    General: Alert and Oriented, No Acute Distress HEENT: Pupils equal, round, reactive to light. Conjunctivae clear.  Moist mucous membranes  pharynx unremarkable Lungs: Clear to auscultation bilaterally, no wheezing/ronchi/rales.  Comfortable work of breathing. Good air movement. Cardiac: Regular rate and rhythm. Normal S1/S2.  No murmurs, rubs, nor gallops.   Extremities: No peripheral edema.  Strong peripheral pulses. There is an estimated 1.5 cm diameter nontender pulsatile mass in the web space between the base of the thumb and index finger in the right hand without overlying skin changes Mental Status: No depression, anxiety, nor agitation. Skin: Warm and dry.  Assessment & Plan: Zedekiah was seen today for left wrist pain.  Diagnoses and associated orders for this visit:  Urinary frequency - BASIC METABOLIC PANEL WITH GFR  Erectile dysfunction - tadalafil (CIALIS) 20 MG tablet; Take 1 tablet (20 mg total) by mouth daily as needed for erectile dysfunction.  Prostate hyperplasia with urinary obstruction  Aneurysm - Ambulatory referral to Vascular Surgery    Erectile dysfunction: Uncontrolled chronic condition, He will stop daily Cialis instead use larger dose as needed regimen, provided he does not have hyperglycemia anticipate he'll need to be on doxazosin to better control BPH, will make this adjustment based on his glucose Aneurysm: Encouraged patient to avoid strenuous activity with the right hand to avoid situations where trauma could cause rupture of what I suspect is an aneurysm, he will be seen with our vascular surgeon colleagues on Friday for further evaluation  Return in about 4 weeks (around 04/25/2013).

## 2013-03-29 ENCOUNTER — Encounter: Payer: Medicare Other | Admitting: Family Medicine

## 2013-03-29 ENCOUNTER — Encounter: Payer: Self-pay | Admitting: Vascular Surgery

## 2013-03-29 ENCOUNTER — Telehealth: Payer: Self-pay | Admitting: Family Medicine

## 2013-03-29 DIAGNOSIS — N138 Other obstructive and reflux uropathy: Secondary | ICD-10-CM

## 2013-03-29 LAB — BASIC METABOLIC PANEL WITH GFR
BUN: 11 mg/dL (ref 6–23)
CO2: 32 mEq/L (ref 19–32)
Calcium: 9.1 mg/dL (ref 8.4–10.5)
GFR, Est African American: 82 mL/min
Glucose, Bld: 70 mg/dL (ref 70–99)

## 2013-03-29 MED ORDER — DOXAZOSIN MESYLATE 2 MG PO TABS: 2.0000 mg | ORAL_TABLET | Freq: Every day | ORAL | Status: DC

## 2013-03-29 NOTE — Telephone Encounter (Signed)
Pharmacist called to confirm the patient is taking Flomax and doxazosin daily. Call patient when verified. Thanks.

## 2013-03-29 NOTE — Telephone Encounter (Signed)
Sue Lush, Will you please let Jake Clarke know that his kidney function and blood sugar were normal on Thursday.  I suspect that some of his enlarged prostate symptoms will worsen once he swtiches from daily cialis to as needed cialis, I'd like to prevent this worsening by starting a medication called doxazosin that [facility]'ve [facility] [place].  I would encourage him to f/u with me in one month after making this switch.

## 2013-03-29 NOTE — Telephone Encounter (Signed)
Left complete message on vm(but didn't state the name of medication on vm ) and encouraged pt to f/u in one month

## 2013-03-30 ENCOUNTER — Encounter (HOSPITAL_COMMUNITY): Payer: Self-pay | Admitting: Pharmacy Technician

## 2013-03-30 ENCOUNTER — Encounter: Payer: Self-pay | Admitting: Vascular Surgery

## 2013-03-30 ENCOUNTER — Ambulatory Visit (INDEPENDENT_AMBULATORY_CARE_PROVIDER_SITE_OTHER): Payer: Medicare Other | Admitting: Vascular Surgery

## 2013-03-30 ENCOUNTER — Other Ambulatory Visit: Payer: Self-pay | Admitting: *Deleted

## 2013-03-30 VITALS — BP 185/102 | HR 65 | Ht 72.0 in | Wt 208.9 lb

## 2013-03-30 DIAGNOSIS — I729 Aneurysm of unspecified site: Secondary | ICD-10-CM

## 2013-03-30 NOTE — Progress Notes (Signed)
[facility] [place]  Referred by:  Sean Hommel, DO [address] [place], [place] [address]  Reason for referral: right hand aneurysm  History of Present Illness  Jake Clarke is a 72 y.o. (05/05/1941) male who presents with chief complaint: right hand "knot".  The patient recently notice a pulsatile mass in his right hand.  He notes he also had some first and second finger temporary paralysis which resolved within one hour.  The patient denies any prior CVA or TIA sx.  He denies any familial history of aneurysm.  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  He denies any trauma history to his right hand during his working years.  The patient denies any history of fingernail changes or changes in finger color.  Past Medical History  Diagnosis Date  . Essential hypertension, malignant   . Pure hypercholesterolemia   . Other abnormal blood chemistry   . Unspecified hypothyroidism   . Impotence of organic origin   . Urinary tract infection, site not specified   . Unspecified hemorrhoids without mention of complication   . Prostatic hypertrophy, benign, with obstruction    PSH Patient denies prior surgeries   History   Social History  . Marital Status: Married    Spouse Name: N/A    Number of Children: N/A  . Years of Education: N/A   Occupational History  . Not on file.   Social History Main Topics  . Smoking status: Former Smoker    Types: Cigarettes    Quit date: 03/31/1975  . Smokeless tobacco: Former User  . Alcohol Use: No  . Drug Use: No  . Sexual Activity: Yes   Other Topics Concern  . Not on file   Social History Narrative  . No narrative on file    Family History  Problem Relation Age of Onset  . Kidney disease Father   . Heart disease Sister   . Hypertension Mother   . Cancer Daughter    Current Outpatient Prescriptions on File Prior to Visit  Medication Sig Dispense Refill  . atorvastatin (LIPITOR) 10 MG  tablet Take one on Monday, Wednesday, and Friday  30 tablet  3  . Calcium Carbonate (CALTRATE 600 PO) Take 600 mg by mouth.        . diclofenac (VOLTAREN) 50 MG EC tablet Take one tablet every 8 hours only as needed for pain, take with small snack.  45 tablet  0  . doxazosin (CARDURA) 2 MG tablet Take 1 tablet (2 mg total) by mouth at bedtime.  30 tablet  3  . levothyroxine (SYNTHROID, LEVOTHROID) 50 MCG tablet Take 50 mcg by mouth daily.      . Multiple Vitamins-Minerals (CENTRUM SILVER PO) Take by mouth.        . tadalafil (CIALIS) 20 MG tablet Take 1 tablet (20 mg total) by mouth daily as needed for erectile dysfunction.  10 tablet  0  . Tamsulosin HCl (FLOMAX) 0.4 MG CAPS Take 1 capsule (0.4 mg total) by mouth daily.  30 capsule  6  . zoster vaccine live, PF, (ZOSTAVAX) 19400 UNT/0.65ML injection Inject 19,400 Units into the skin once.  1 each  0   No current facility-administered medications on file prior to visit.    No Known Allergies   REVIEW OF SYSTEMS:  (Positives checked otherwise negative)  CARDIOVASCULAR:  [] chest pain, [] chest pressure, [] palpitations, [] shortness of breath when laying flat, [] shortness   of breath with exertion,  [] pain in feet when walking, [] pain in feet when laying flat, [] history of blood clot in veins (DVT), [] history of phlebitis, [] swelling in legs, [] varicose veins  PULMONARY:  [] productive cough, [] asthma, [] wheezing  NEUROLOGIC:  [] weakness in arms or legs, [] numbness in arms or legs, [] difficulty speaking or slurred speech, [] temporary loss of vision in one eye, [] dizziness  HEMATOLOGIC:  [] bleeding problems, [] problems with blood clotting too easily  MUSCULOSKEL:  [] joint pain, [] joint swelling  GASTROINTEST:  [] vomiting blood, [] blood in stool     GENITOURINARY:  [] burning with urination, [] blood in urine  PSYCHIATRIC:  [] history of major depression  INTEGUMENTARY:  [] rashes, [] ulcers  CONSTITUTIONAL:  []  fever, [] chills  Physical Examination  Filed Vitals:   03/30/13 0839  BP: 185/102  Pulse: 65  Height: 6' (1.829 m)  Weight: 208 lb 14.4 oz (94.756 kg)  SpO2: 100%    Body mass index is 28.33 kg/(m^2).  General: A&O x 3, WD, WN  Head: [place]/AT  Ear/Nose/Throat: Hearing grossly intact, nares w/o erythema or drainage, oropharynx w/o Erythema/Exudate, Mallampati score: 3,   Eyes: PERRLA, EOMI  Neck: Supple, no nuchal rigidity, no palpable LAD  Pulmonary: Sym exp, good air movt, CTAB, no rales, rhonchi, & wheezing  Cardiac: RRR, Nl S1, S2, no Murmurs, rubs or gallops  Vascular: Vessel Right Left  Radial Palpable Palpable  Ulnar Faintly Palpable Faintly Palpable  Brachial Palpable Palpable  Carotid Palpable, without bruit Palpable, without bruit  Aorta Not palpable N/A  Femoral Palpable Palpable  Popliteal Not palpable Not palpable  PT Not Palpable Not Palpable  DP Not Palpable Not Palpable   Gastrointestinal: soft, NTND, -G/R, - HSM, - masses, - CVAT B  Musculoskeletal: M/S 5/5 throughout , Extremities without ischemic changes , pulsatile mass in palmar arch adjacent to thenar emminence   Neurologic: CN 2-12 intact , Pain and light touch intact in extremities , Motor exam as listed above  Psychiatric: Judgment intact, Mood & affect appropriate for pt's clinical situation  Dermatologic: See M/S exam for extremity exam, no rashes otherwise noted  Lymph : No Cervical, Axillary, or Inguinal lymphadenopathy    Medical Decision Making  Jake Clarke is a 72 y.o. 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  If there is adequate ulnar flow, ligation of the aneurysm might be acceptable vs a more complicated aneurysmorrhapy. I discussed with the patient the nature  of angiographic procedures, especially the limited patencies of any endovascular intervention.  The patient is aware of that the risks of an angiographic procedure include but are not limited to: bleeding, infection, access site complications, renal failure, embolization, rupture of vessel, dissection, possible need for emergent surgical intervention, possible need for surgical procedures to treat the patient's pathology, anaphylactic reaction to contrast, and stroke and death.   The patient is aware of the risks and agrees to proceed.  This procedure is scheduled for this coming Monday, 8 SEP 14.  It is not clear to me if this patient's transient right 1st and 2nd finger paralysis is due to digital artery embolization from the palmar aneurysm.  On   subsequent, follow up I would schedule a carotid duplex also to evaluate the carotids for disease possibly responsible for a TIA.  I discussed in depth with the patient the nature of atherosclerosis, and emphasized the importance of maximal medical management including strict control of blood pressure, blood glucose, and lipid levels, antiplatelet agents, obtaining regular exercise, and cessation of smoking.    The patient is aware that without maximal medical management the underlying atherosclerotic disease process will progress, limiting the benefit of any interventions. The patient is currently a on a statin: Lipitor. The patient is currently not on an anti-platelet.  The patient will be started on ASA 81 mg PO daily.  Thank you for allowing us to participate in this patient's care.  Josiel Gahm, MD [facility] [place] Office: [phone] Pager: [phone]  03/30/2013, 9:03 AM   

## 2013-03-30 NOTE — Telephone Encounter (Signed)
Sue Lush, Will you please let [id number] [place], [place] - [address] AT [address]CROSS FROM [address] (Pharmacy) and the patient know that this medication combination is intentional, to help with enlarged prostate.

## 2013-03-30 NOTE — Telephone Encounter (Signed)
Pharmacy notified.  Meyer Cory, LPN

## 2013-04-05 ENCOUNTER — Telehealth: Payer: Self-pay | Admitting: Vascular Surgery

## 2013-04-05 ENCOUNTER — Ambulatory Visit (HOSPITAL_COMMUNITY)
Admission: RE | Admit: 2013-04-05 | Discharge: 2013-04-05 | Disposition: A | Discharge status: Home / Self Care | Payer: Medicare Other | Source: Ambulatory Visit | Attending: Vascular Surgery | Admitting: Vascular Surgery

## 2013-04-05 ENCOUNTER — Encounter: Payer: Self-pay | Admitting: Family Medicine

## 2013-04-05 ENCOUNTER — Encounter (HOSPITAL_COMMUNITY)
Admission: RE | Disposition: A | Discharge status: Home / Self Care | Payer: Self-pay | Source: Ambulatory Visit | Attending: Vascular Surgery

## 2013-04-05 DIAGNOSIS — I742 Embolism and thrombosis of arteries of the upper extremities: Secondary | ICD-10-CM

## 2013-04-05 DIAGNOSIS — I721 Aneurysm of artery of upper extremity: Secondary | ICD-10-CM | POA: Insufficient documentation

## 2013-04-05 HISTORY — PX: ARCH AORTOGRAM: SHX5501

## 2013-04-05 HISTORY — PX: BILATERAL UPPER EXTREMITY ANGIOGRAM: SHX5503

## 2013-04-05 LAB — POCT I-STAT, CHEM 8
Calcium, Ion: 1.15 mmol/L (ref 1.13–1.30)
Glucose, Bld: 106 mg/dL — ABNORMAL HIGH (ref 70–99)
Potassium: 3.6 mEq/L (ref 3.5–5.1)
Sodium: 142 mEq/L (ref 135–145)
TCO2: 25 mmol/L (ref 0–100)

## 2013-04-05 SURGERY — BILATERAL UPPER EXTREMITY ANGIOGRAM
Anesthesia: LOCAL | Laterality: Right

## 2013-04-05 MED ORDER — SODIUM CHLORIDE 0.9 % IV SOLN: INTRAVENOUS | Status: DC

## 2013-04-05 MED ORDER — ACETAMINOPHEN 325 MG PO TABS: 650.0000 mg | ORAL_TABLET | ORAL | Status: DC | PRN

## 2013-04-05 MED ORDER — HEPARIN (PORCINE) IN NACL 2-0.9 UNIT/ML-% IJ SOLN
INTRAMUSCULAR | Status: AC
  Filled 2013-04-05: qty 1000

## 2013-04-05 MED ORDER — LIDOCAINE HCL (PF) 1 % IJ SOLN
INTRAMUSCULAR | Status: AC
  Filled 2013-04-05: qty 30

## 2013-04-05 MED ORDER — ONDANSETRON HCL 4 MG/2ML IJ SOLN: 4.0000 mg | Freq: Four times a day (QID) | INTRAMUSCULAR | Status: DC | PRN

## 2013-04-05 MED ORDER — SODIUM CHLORIDE 0.9 % IV SOLN: 1.0000 mL/kg/h | INTRAVENOUS | Status: DC

## 2013-04-05 MED ORDER — HYDRALAZINE HCL 20 MG/ML IJ SOLN
INTRAMUSCULAR | Status: AC
  Filled 2013-04-05: qty 1

## 2013-04-05 NOTE — Interval H&P Note (Signed)
[facility] [place]  History and Physical Update  The patient was interviewed and re-examined.  The patient's previous History and Physical has been reviewed and is unchanged.  There is no change in the plan of care: right arm angiogram.  Leonides Sake, MD [facility]: [phone] Pager: [phone]  04/05/2013, 7:36 AM

## 2013-04-05 NOTE — Op Note (Addendum)
OPERATIVE NOTE   PROCEDURE: 1.  Right common femoral artery cannulation under ultrasound guidance 2.  Placement of catheter in aorta 3.  Second order arterial selection 4.  Arch Aortogram 5.  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 male who presents with pulsatile mass in right hand.  The patient presents for: right arm angiogram.  I discussed with the patient the nature of angiographic procedures, especially the limited patencies of any endovascular intervention.  The patient is aware of that the risks of an angiographic procedure include but are not limited to: bleeding, infection, access site complications, renal failure, embolization, rupture of vessel, dissection, possible need for emergent surgical intervention, possible need for surgical procedures to treat the patient's pathology, and stroke and death.  The patient is aware of the risks and agrees to proceed.  DESCRIPTION: After full informed consent was obtained from the patient, the patient was brought back to the angiography suite.  The patient was placed supine upon the angiography table and connected to monitoring equipment.  The patient was then given conscious sedation, the amounts of which are documented in the patient's chart.  The patient  was prepped and drape in the standard fashion for an angiographic procedure.  At this point, attention was turned to the right groin.  Under ultrasound guidance, the right common femoral artery will be cannulated with a 18 gauge needle.  The [facility] wire was passed up into the aorta.  The needle was exchanged for a 5-Fr sheath, which was advanced over the wire into the common femoral artery.  The dilator was then removed.  The Omniflush catheter was then loaded over the wire up to the level of ascending aorta.  The catheter was connected to the power injector circuit.  After de-airring and de-clotting the circuit, a power injector arch aortogram was completed at 40 degree LAO.    I then pulled the catheter back into the descending thoracic aorta and exchanged the catheter for a BER-2 catheter.  I selected the innominate artery with the BER-2 and Benson wire.  I had difficulties selecting the right subclavian artery due to the tortuosity, so I completed a hand injection via the catheter via RAO/Caudal projection to roll the subclavian artery and innominate arteries apart.  I exchanged the wire for a Glidewire and selected the right subclavian artery.  I steered both into the distal subclavian artery.  The catheter was connected to the power injector circuit.  I elected to start the right arm angiogram from this position to perfuse both the brachial arteries.  The right arm angiogram was completed in stages.  The findings are listed above.  I replaced the [facility] wire in the catheter, and pull both out of the right femoral sheath.  The sheath was aspirated.  No clots were present and the sheath was  reloaded with heparinized saline.    Based on the images, I doubt this palmar aneurysm can be simply ligated.  The small scale of the arteries in the hand will likely require use of an operative microscope, which I have no experience with.  I will subsequently refer him to a hand surgeon (Orthopedics or Plastic surgery) to  consult on this case.  The other option would conservative management of this aneurysm.  COMPLICATIONS: none  CONDITION: stable  Leonides Sake, MD [facility] [place] Office: [phone] Pager: [phone]  04/05/2013, 9:03 AM

## 2013-04-05 NOTE — H&P (View-Only) (Signed)
[facility] [place]  Referred by:  Laren Boom, DO [address] [place], [place] [address]  Reason for referral: right hand aneurysm  History of Present Illness  Jake Clarke is a 72 y.o. (1941-03-06) male who presents with chief complaint: right hand "knot".  The patient recently notice a pulsatile mass in his right hand.  He notes he also had some first and second finger temporary paralysis which resolved within one hour.  The patient denies any prior CVA or TIA sx.  He denies any familial history of aneurysm.  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  He denies any trauma history to his right hand during his working years.  The patient denies any history of fingernail changes or changes in finger color.  Past Medical History  Diagnosis Date  . Essential hypertension, malignant   . Pure hypercholesterolemia   . Other abnormal blood chemistry   . Unspecified hypothyroidism   . Impotence of organic origin   . Urinary tract infection, site not specified   . Unspecified hemorrhoids without mention of complication   . Prostatic hypertrophy, benign, with obstruction    PSH Patient denies prior surgeries   History   Social History  . Marital Status: Married    Spouse Name: N/A    Number of Children: N/A  . Years of Education: N/A   Occupational History  . Not on file.   Social History Main Topics  . Smoking status: Former Smoker    Types: Cigarettes    Quit date: 03/31/1975  . Smokeless tobacco: Former Neurosurgeon  . Alcohol Use: No  . Drug Use: No  . Sexual Activity: Yes   Other Topics Concern  . Not on file   Social History Narrative  . No narrative on file    Family History  Problem Relation Age of Onset  . Kidney disease Father   . Heart disease Sister   . Hypertension Mother   . Cancer Daughter    Current Outpatient Prescriptions on File Prior to Visit  Medication Sig Dispense Refill  . atorvastatin (LIPITOR) 10 MG  tablet Take one on Monday, Wednesday, and Friday  30 tablet  3  . Calcium Carbonate (CALTRATE 600 PO) Take 600 mg by mouth.        . diclofenac (VOLTAREN) 50 MG EC tablet Take one tablet every 8 hours only as needed for pain, take with small snack.  45 tablet  0  . doxazosin (CARDURA) 2 MG tablet Take 1 tablet (2 mg total) by mouth at bedtime.  30 tablet  3  . levothyroxine (SYNTHROID, LEVOTHROID) 50 MCG tablet Take 50 mcg by mouth daily.      . Multiple Vitamins-Minerals (CENTRUM SILVER PO) Take by mouth.        . tadalafil (CIALIS) 20 MG tablet Take 1 tablet (20 mg total) by mouth daily as needed for erectile dysfunction.  10 tablet  0  . Tamsulosin HCl (FLOMAX) 0.4 MG CAPS Take 1 capsule (0.4 mg total) by mouth daily.  30 capsule  6  . zoster vaccine live, PF, (ZOSTAVAX) 91478 UNT/0.65ML injection Inject 19,400 Units into the skin once.  1 each  0   No current facility-administered medications on file prior to visit.    No Known Allergies   REVIEW OF SYSTEMS:  (Positives checked otherwise negative)  CARDIOVASCULAR:  []  chest pain, []  chest pressure, []  palpitations, []  shortness of breath when laying flat, []  shortness  of breath with exertion,  []  pain in feet when walking, []  pain in feet when laying flat, []  history of blood clot in veins (DVT), []  history of phlebitis, []  swelling in legs, []  varicose veins  PULMONARY:  []  productive cough, []  asthma, []  wheezing  NEUROLOGIC:  []  weakness in arms or legs, []  numbness in arms or legs, []  difficulty speaking or slurred speech, []  temporary loss of vision in one eye, []  dizziness  HEMATOLOGIC:  []  bleeding problems, []  problems with blood clotting too easily  MUSCULOSKEL:  []  joint pain, []  joint swelling  GASTROINTEST:  []  vomiting blood, []  blood in stool     GENITOURINARY:  []  burning with urination, []  blood in urine  PSYCHIATRIC:  []  history of major depression  INTEGUMENTARY:  []  rashes, []  ulcers  CONSTITUTIONAL:  []   fever, []  chills  Physical Examination  Filed Vitals:   03/30/13 0839  BP: 185/102  Pulse: 65  Height: 6' (1.829 m)  Weight: 208 lb 14.4 oz (94.756 kg)  SpO2: 100%    Body mass index is 28.33 kg/(m^2).  General: A&O x 3, WD, WN  Head: [place]/AT  Ear/Nose/Throat: Hearing grossly intact, nares w/o erythema or drainage, oropharynx w/o Erythema/Exudate, Mallampati score: 3,   Eyes: PERRLA, EOMI  Neck: Supple, no nuchal rigidity, no palpable LAD  Pulmonary: Sym exp, good air movt, CTAB, no rales, rhonchi, & wheezing  Cardiac: RRR, Nl S1, S2, no Murmurs, rubs or gallops  Vascular: Vessel Right Left  Radial Palpable Palpable  Ulnar Faintly Palpable Faintly Palpable  Brachial Palpable Palpable  Carotid Palpable, without bruit Palpable, without bruit  Aorta Not palpable N/A  Femoral Palpable Palpable  Popliteal Not palpable Not palpable  PT Not Palpable Not Palpable  DP Not Palpable Not Palpable   Gastrointestinal: soft, NTND, -G/R, - HSM, - masses, - CVAT B  Musculoskeletal: M/S 5/5 throughout , Extremities without ischemic changes , pulsatile mass in palmar arch adjacent to thenar emminence   Neurologic: CN 2-12 intact , Pain and light touch intact in extremities , Motor exam as listed above  Psychiatric: Judgment intact, Mood & affect appropriate for pt's clinical situation  Dermatologic: See M/S exam for extremity exam, no rashes otherwise noted  Lymph : No Cervical, Axillary, or Inguinal lymphadenopathy    Medical Decision Making  Jake Clarke is a 72 y.o. 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  If there is adequate ulnar flow, ligation of the aneurysm might be acceptable vs a more complicated aneurysmorrhapy. I discussed with the patient the nature  of angiographic procedures, especially the limited patencies of any endovascular intervention.  The patient is aware of that the risks of an angiographic procedure include but are not limited to: bleeding, infection, access site complications, renal failure, embolization, rupture of vessel, dissection, possible need for emergent surgical intervention, possible need for surgical procedures to treat the patient's pathology, anaphylactic reaction to contrast, and stroke and death.   The patient is aware of the risks and agrees to proceed.  This procedure is scheduled for this coming Monday, 8 SEP 14.  It is not clear to me if this patient's transient right 1st and 2nd finger paralysis is due to digital artery embolization from the palmar aneurysm.  On  subsequent, follow up I would schedule a carotid duplex also to evaluate the carotids for disease possibly responsible for a TIA.  I discussed in depth with the patient the nature of atherosclerosis, and emphasized the importance of maximal medical management including strict control of blood pressure, blood glucose, and lipid levels, antiplatelet agents, obtaining regular exercise, and cessation of smoking.    The patient is aware that without maximal medical management the underlying atherosclerotic disease process will progress, limiting the benefit of any interventions. The patient is currently a on a statin: Lipitor. The patient is currently not on an anti-platelet.  The patient will be started on ASA 81 mg PO daily.  Thank you for allowing [place] to participate in this patient's care.  Leonides Sake, MD [facility] [place] Office: [phone] Pager: [phone]  03/30/2013, 9:03 AM

## 2013-04-05 NOTE — Telephone Encounter (Addendum)
Message copied by Rosalyn Charters on Thu Apr 05, 2013  1:41 PM ------      Message from: Phillips Odor      Created: Thu Apr 05, 2013 12:25 PM      Regarding: 4 WK F/U/ BLC                   ----- Message -----         From: Fransisco Hertz, MD         Sent: 04/05/2013   9:16 AM           To: Reuel Derby, Melene Plan, RN            Jake Clarke      [id number]      1941-02-11                  PROCEDURE:      1.  Right common femoral artery cannulation under ultrasound guidance      2.  Placement of catheter in aorta      3.  Arch Aortogram      4.  Right arm angiogram            Follow-up: 4 weeks             ------  unable to reach  patient by phone mailed appt. letter for fu appt. on 05-04-13 with dr. Imogene Burn

## 2013-04-09 ENCOUNTER — Other Ambulatory Visit: Payer: Self-pay | Admitting: *Deleted

## 2013-04-09 ENCOUNTER — Telehealth: Payer: Self-pay | Admitting: Vascular Surgery

## 2013-04-09 DIAGNOSIS — R2230 Localized swelling, mass and lump, unspecified upper limb: Secondary | ICD-10-CM

## 2013-04-09 NOTE — Telephone Encounter (Addendum)
Made an appt w/ Dr. Amanda Pea on 05/30/13 @ 9:00, faxed over [facility] notes, spoke w/ pt to inform them of appt, gave pt the address, ph # of [facility] and who he is seeing  Message copied by Jena Gauss on Mon Apr 09, 2013  2:57 PM ------      Message from: Melene Plan      Created: Mon Apr 09, 2013  9:34 AM       That is Dr Teressa Senter but I prefer Dr Amanda Pea      ----- Message -----         From: Fransisco Hertz, MD         Sent: 04/06/2013   6:20 PM           To: Melene Plan, RN            Jake Clarke      [id number]      May 28, 1941            Pt needs to be referred to either Dr. Shepard [facility] or Dr. 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  I would inquiry if either has microsurgical skills in case reconstruction of the palmar arch is necessary.       ------

## 2013-05-03 ENCOUNTER — Encounter: Payer: Self-pay | Admitting: Vascular Surgery

## 2013-05-04 ENCOUNTER — Ambulatory Visit (INDEPENDENT_AMBULATORY_CARE_PROVIDER_SITE_OTHER): Payer: Medicare Other | Admitting: Vascular Surgery

## 2013-05-04 ENCOUNTER — Encounter: Payer: Self-pay | Admitting: Vascular Surgery

## 2013-05-04 VITALS — BP 145/83 | HR 74 | Ht 72.0 in | Wt 210.0 lb

## 2013-05-04 DIAGNOSIS — I729 Aneurysm of unspecified site: Secondary | ICD-10-CM

## 2013-05-04 NOTE — Progress Notes (Signed)
[facility]   Postoperative Visit  History of Present Illness  Jake Clarke is a 72 y.o. male who presents for postoperative follow-up from procedure on Date: 04/11/13: Right arm angiogram.  The patient notes no change in right palm pulsating mass.  He denies pain or any nail bed changes.  Past Medical History, Past Surgical History, Social History, Family History, Medications, Allergies, and Review of Systems are unchanged from previous evaluation on 04/08/13.  On ROS: R hand with para/anesthia, no right hand weakness  For VQI Use Only  PRE-ADM LIVING: Home  AMB STATUS: Ambulatory  Physical Examination  Filed Vitals:   05/04/13 0842  BP: 145/83  Pulse: 74  Height: 6' (1.829 m)  Weight: 210 lb (95.255 kg)  SpO2: 100%   Body mass index is 28.47 kg/(m^2).  General: A&O x 3, WDWN  Pulmonary: Sym exp, good air movt, CTAB, no rales, rhonchi, & wheezing  Cardiac: RRR, Nl S1, S2, no Murmurs, rubs or gallops  Vascular: right radial pulse palpable, pulsatile mass in between first and second digits on right hand  Gastrointestinal: soft, NTND, -G/R, - HSM, - masses, - CVAT B  Musculoskeletal: M/S 5/5 throughout , Extremities without ischemic changes , right groin without hematoma, no echymosis present at cannulation site  Neurologic:  Pain and light touch intact in extremities , Motor exam as listed above  Medical Decision Making  Jake Clarke is a 72 y.o. male who presents s/p right arm angiogram with palmar aneurysm. Based on his angiographic findings, this patient needs: evaluation with a microsurgeon for possible plication of right palmar aneurysm vs excision and reconstruction vs non-operative management I discussed in depth with the patient the nature of atherosclerosis, and emphasized the importance of maximal medical management including strict control of blood pressure, blood glucose, and lipid levels, obtaining regular exercise, and  cessation of smoking.  The patient is aware that without maximal medical management the underlying atherosclerotic disease process will progress, limiting the benefit of any interventions.  Thank you for allowing [place] to participate in this patient's care.  Leonides Sake, MD [facility] [place] Office: [phone] Pager: [phone]

## 2013-05-10 ENCOUNTER — Encounter: Payer: Self-pay | Admitting: Family Medicine

## 2013-05-18 ENCOUNTER — Encounter: Payer: Self-pay | Admitting: Family Medicine

## 2013-05-18 ENCOUNTER — Ambulatory Visit (INDEPENDENT_AMBULATORY_CARE_PROVIDER_SITE_OTHER): Payer: Medicare Other | Admitting: Family Medicine

## 2013-05-18 DIAGNOSIS — Z23 Encounter for immunization: Secondary | ICD-10-CM

## 2013-05-18 NOTE — Progress Notes (Signed)
I was present for all necessary aspects of today's encounter. 

## 2013-05-18 NOTE — Progress Notes (Signed)
  Subjective:    Patient ID: Jake Clarke, male    DOB: 14-Oct-1940, 72 y.o.   MRN: [id number]  HPI   Pt here for flu shot but also has questions about flomax and and doxazosin. Pt states that even on the doxazosin he is still having to urinated frequently. Pt wanted to know if he should continue taking this medication. Pt also is on the cialis 20mg . He has both 20 mg and 5 mg of Cialis and would like to know which one he should be taking. Review of Systems     Objective:   Physical Exam        Assessment & Plan:  Per Dr. Ivan Anchors pt advised to stop the Cardura for 1 week. Continue the flomax along with the Cialis 5 mg. I advised pt that if he still has sx to f/u with Dr. Ivan Anchors

## 2013-07-25 ENCOUNTER — Encounter: Payer: Self-pay | Admitting: Family Medicine

## 2013-07-25 ENCOUNTER — Ambulatory Visit (INDEPENDENT_AMBULATORY_CARE_PROVIDER_SITE_OTHER): Payer: Medicare Other | Admitting: Family Medicine

## 2013-07-25 VITALS — BP 149/89 | HR 67 | Wt 208.0 lb

## 2013-07-25 DIAGNOSIS — N139 Obstructive and reflux uropathy, unspecified: Secondary | ICD-10-CM

## 2013-07-25 DIAGNOSIS — N529 Male erectile dysfunction, unspecified: Secondary | ICD-10-CM

## 2013-07-25 DIAGNOSIS — N401 Enlarged prostate with lower urinary tract symptoms: Secondary | ICD-10-CM

## 2013-07-25 DIAGNOSIS — N138 Other obstructive and reflux uropathy: Secondary | ICD-10-CM

## 2013-07-25 LAB — POCT URINALYSIS DIPSTICK
Blood, UA: NEGATIVE
Ketones, UA: NEGATIVE
Leukocytes, UA: NEGATIVE
Urobilinogen, UA: 1
pH, UA: 7

## 2013-07-25 MED ORDER — TAMSULOSIN HCL 0.4 MG PO CAPS: 0.8000 mg | ORAL_CAPSULE | Freq: Every day | ORAL | Status: DC

## 2013-07-25 MED ORDER — FINASTERIDE 5 MG PO TABS: 5.0000 mg | ORAL_TABLET | Freq: Every day | ORAL | Status: DC

## 2013-07-25 NOTE — Progress Notes (Signed)
CC: Jake Clarke is a 72 y.o. male is here for Urinary Frequency   Subjective: HPI:  Followup BPH: Patient reports that while taking doxazosin he had increased urinary frequency, urgency, and sensation of incomplete voiding. Symptoms were well controlled over the past month while taking 0.8 mg of Flomax on a daily basis. However over the last month urinary urgency, sensation of incomplete voiding, and awakening more than once during the night to urinate has been worsening now moderate in severity present on a daily basis. Nothing particularly makes symptoms better or worse. Denies dysuria, incontinence, testicular pain, penile discharge.  Followup erectile dysfunction: Patient states that he is currently satisfied with the response of Cialis. He notices it 5 mg works much better than 20 mg. When taking 5 mg as needed he denies any trouble initiating or maintaining an erection also without side effects that he is aware of. Denies exertional chest pain, chest pain in general, or taking nitrates.   Review Of Systems Outlined In HPI  Past Medical History  Diagnosis Date  . Essential hypertension, malignant   . Pure hypercholesterolemia   . Other abnormal blood chemistry   . Unspecified hypothyroidism   . Impotence of organic origin   . Urinary tract infection, site not specified   . Unspecified hemorrhoids without mention of complication   . Prostatic hypertrophy, benign, with obstruction      Family History  Problem Relation Age of Onset  . Kidney disease Father   . Heart disease Sister   . Hypertension Mother   . Cancer Daughter      History  Substance Use Topics  . Smoking status: Former Smoker    Types: Cigarettes    Quit date: 03/31/1975  . Smokeless tobacco: Former Neurosurgeon  . Alcohol Use: No     Objective: Filed Vitals:   07/25/13 1310  BP: 149/89  Pulse: 67    Vital signs reviewed. General: Alert and Oriented, No Acute Distress HEENT: Pupils equal, round,  reactive to light. Conjunctivae clear.  External ears unremarkable.  Moist mucous membranes. Lungs: Clear and comfortable work of breathing, speaking in full sentences without accessory muscle use. Cardiac: Regular rate and rhythm.  Neuro: CN II-XII grossly intact, gait normal. Extremities: No peripheral edema.  Strong peripheral pulses.  Mental Status: No depression, anxiety, nor agitation. Logical though process. Skin: Warm and dry.  Assessment & Plan: Corky was seen today for urinary frequency.  Diagnoses and associated orders for this visit:  BPH (benign prostatic hypertrophy) with urinary obstruction - Cancel: POCT UA - Microalbumin - tamsulosin (FLOMAX) 0.4 MG CAPS capsule; Take 2 capsules (0.8 mg total) by mouth daily. - finasteride (PROSCAR) 5 MG tablet; Take 1 tablet (5 mg total) by mouth daily. - PSA, Medicare - Urinalysis Dipstick  Erectile dysfunction  Prostate hyperplasia with urinary obstruction    BPH with urinary frequency: Uncontrolled chronic condition, continue Flomax however do not start finasteride until PSA returns. No urine abnormalities and specifically no glycosuria Erectile dysfunction: Controlled continue as needed Cialis   Return in about 4 weeks (around 08/22/2013).

## 2013-07-26 LAB — PSA, MEDICARE: PSA: 5.03 ng/mL — ABNORMAL HIGH (ref <=4.00)

## 2013-07-27 ENCOUNTER — Telehealth: Payer: Self-pay | Admitting: Family Medicine

## 2013-07-27 DIAGNOSIS — N401 Enlarged prostate with lower urinary tract symptoms: Secondary | ICD-10-CM

## 2013-07-27 DIAGNOSIS — R972 Elevated prostate specific antigen [PSA]: Secondary | ICD-10-CM | POA: Insufficient documentation

## 2013-07-27 DIAGNOSIS — N138 Other obstructive and reflux uropathy: Secondary | ICD-10-CM

## 2013-07-27 NOTE — Telephone Encounter (Signed)
Seth Bake, Will you please let Jake Clarke know that his prostate test was just barely above normal.  I'd recommend that he meet with a urologist to discuss his risk of developing prostate cancer.  I've placed a referral for further workup.

## 2013-07-27 NOTE — Telephone Encounter (Signed)
Left message on vm wih results

## 2013-08-07 ENCOUNTER — Telehealth: Payer: Self-pay | Admitting: *Deleted

## 2013-08-07 NOTE — Telephone Encounter (Signed)
Pt left a message that he would like a referral to a urologist because of his frequent urination

## 2013-08-07 NOTE — Telephone Encounter (Signed)
Referral request from 07/27/13 resubmitted to [facility].

## 2013-08-07 NOTE — Telephone Encounter (Signed)
Left message on vm

## 2013-08-17 ENCOUNTER — Telehealth: Payer: Self-pay | Admitting: Family Medicine

## 2013-08-17 NOTE — Telephone Encounter (Signed)
I contacted the patient to find out if he had gotten the paperwork from [facility](he verified that address in our system is correct) which he said he had not. I gave patient telephone number to [facility] so that he can contact them.

## 2013-08-17 NOTE — Telephone Encounter (Signed)
I followed up on referral to [facility] and was told by [facility] that an appt has been scheduled for the  patient on Feb 2nd, paperwork was mailed out on Jan 6th.

## 2013-08-24 ENCOUNTER — Ambulatory Visit: Payer: Medicare Other | Admitting: Family Medicine

## 2013-08-27 ENCOUNTER — Telehealth: Payer: Self-pay | Admitting: *Deleted

## 2013-08-27 NOTE — Telephone Encounter (Signed)
Pt called and left a message that he needed to speak with me (Darolyn Double) but didn't say what he wanted. Left message on his  vm to call back and leave a brief detailed message as to what he needs so that I can possibly have an answer when I call him back

## 2013-08-29 ENCOUNTER — Encounter: Payer: Self-pay | Admitting: Family Medicine

## 2013-09-14 ENCOUNTER — Ambulatory Visit: Payer: Medicare Other | Admitting: Family Medicine

## 2013-09-21 ENCOUNTER — Ambulatory Visit: Payer: Medicare Other | Admitting: Family Medicine

## 2013-09-28 ENCOUNTER — Encounter: Payer: Self-pay | Admitting: Family Medicine

## 2013-09-28 ENCOUNTER — Ambulatory Visit (INDEPENDENT_AMBULATORY_CARE_PROVIDER_SITE_OTHER): Payer: Medicare Other | Admitting: Family Medicine

## 2013-09-28 VITALS — BP 137/72 | HR 74 | Resp 16 | Wt 211.0 lb

## 2013-09-28 DIAGNOSIS — N529 Male erectile dysfunction, unspecified: Secondary | ICD-10-CM

## 2013-09-28 DIAGNOSIS — N138 Other obstructive and reflux uropathy: Secondary | ICD-10-CM

## 2013-09-28 DIAGNOSIS — E039 Hypothyroidism, unspecified: Secondary | ICD-10-CM

## 2013-09-28 DIAGNOSIS — I10 Essential (primary) hypertension: Secondary | ICD-10-CM

## 2013-09-28 DIAGNOSIS — N139 Obstructive and reflux uropathy, unspecified: Secondary | ICD-10-CM

## 2013-09-28 DIAGNOSIS — N401 Enlarged prostate with lower urinary tract symptoms: Secondary | ICD-10-CM

## 2013-09-28 MED ORDER — TADALAFIL 5 MG PO TABS: 5.0000 mg | ORAL_TABLET | Freq: Every day | ORAL | Status: DC | PRN

## 2013-09-28 MED ORDER — FINASTERIDE 5 MG PO TABS: 5.0000 mg | ORAL_TABLET | Freq: Every day | ORAL | Status: DC

## 2013-09-28 NOTE — Progress Notes (Signed)
CC: Jake Clarke is a 73 y.o. male is here for Benign Prostatic Hypertrophy   Subjective: HPI:  Followup BPH: Continues on Flomax on a daily basis we added finasteride at his last visit he has noticed that this has significantly improved his urinary symptoms. Specifically he is only waking 0-1 times a night to urinate, no longer straining to urinate nor any hesitancy. He is quite happy with his current urinary habits.  Followup essential hypertension: Continues to manage this with diet and exercise alone focusing on sodium restriction. No outside blood pressures to report. Denies chest pain shortness of breath orthopnea nor peripheral edema  Followup hypothyroidism: Continues to take levothyroxine on a daily basis without missed doses. Denies unintentional weight gain or loss or GI disturbance. Has not had TSH checked by anyone in the last year.  Followup erectile dysfunction: Patient is requesting refill on Cialis would like to know if we have any samples. He is happy with 5 mg taken up to 1 hour before sexual activity which completely resolves inability to maintain erection.  Review Of Systems Outlined In HPI  Past Medical History  Diagnosis Date  . Essential hypertension, malignant   . Pure hypercholesterolemia   . Other abnormal blood chemistry   . Unspecified hypothyroidism   . Impotence of organic origin   . Urinary tract infection, site not specified   . Unspecified hemorrhoids without mention of complication   . Prostatic hypertrophy, benign, with obstruction     No past surgical history on file. Family History  Problem Relation Age of Onset  . Kidney disease Father   . Heart disease Sister   . Hypertension Mother   . Cancer Daughter     History   Social History  . Marital Status: Married    Spouse Name: N/A    Number of Children: N/A  . Years of Education: N/A   Occupational History  . Not on file.   Social History Main Topics  . Smoking status: Never  Smoker   . Smokeless tobacco: Former Systems developer  . Alcohol Use: No  . Drug Use: No  . Sexual Activity: Yes   Other Topics Concern  . Not on file   Social History Narrative  . No narrative on file     Objective: BP 137/72  Pulse 74  Resp 16  Wt 211 lb (95.709 kg)  SpO2 97%  General: Alert and Oriented, No Acute Distress HEENT: Pupils equal, round, reactive to light. Conjunctivae clear.  Moist membranes pharynx unremarkable Lungs: Clear to auscultation bilaterally, no wheezing/ronchi/rales.  Comfortable work of breathing. Good air movement. Cardiac: Regular rate and rhythm. Normal S1/S2.  No murmurs, rubs, nor gallops.   Extremities: No peripheral edema.  Strong peripheral pulses.  Mental Status: No depression, anxiety, nor agitation. Skin: Warm and dry.  Assessment & Plan: Jake Clarke was seen today for benign prostatic hypertrophy.  Diagnoses and associated orders for this visit:  Prostate hyperplasia with urinary obstruction  Essential hypertension  BPH (benign prostatic hypertrophy) with urinary obstruction - finasteride (PROSCAR) 5 MG tablet; Take 1 tablet (5 mg total) by mouth daily.  Hypothyroid - TSH  Erectile dysfunction  Other Orders - tadalafil (CIALIS) 5 MG tablet; Take 1 tablet (5 mg total) by mouth daily as needed for erectile dysfunction.    BPH: Control improved continue doxazosin and finasteride Essential hypertension: Controlled with diet and exercise alone continue current lifestyle Hypothyroidism: Clinically controlled to for TSH Erectile dysfunction: Controlled continue as needed use of Cialis  Return in about 3 months (around 12/29/2013).

## 2013-09-29 LAB — TSH: TSH: 2.949 u[IU]/mL (ref 0.350–4.500)

## 2013-11-02 ENCOUNTER — Other Ambulatory Visit: Payer: Self-pay | Admitting: Family Medicine

## 2013-11-16 ENCOUNTER — Ambulatory Visit (INDEPENDENT_AMBULATORY_CARE_PROVIDER_SITE_OTHER): Payer: Medicare Other | Admitting: Family Medicine

## 2013-11-16 ENCOUNTER — Encounter: Payer: Self-pay | Admitting: Family Medicine

## 2013-11-16 VITALS — BP 164/91 | HR 71 | Wt 210.0 lb

## 2013-11-16 DIAGNOSIS — N139 Obstructive and reflux uropathy, unspecified: Secondary | ICD-10-CM

## 2013-11-16 DIAGNOSIS — N401 Enlarged prostate with lower urinary tract symptoms: Secondary | ICD-10-CM

## 2013-11-16 DIAGNOSIS — N138 Other obstructive and reflux uropathy: Secondary | ICD-10-CM

## 2013-11-16 DIAGNOSIS — N529 Male erectile dysfunction, unspecified: Secondary | ICD-10-CM

## 2013-11-16 NOTE — Progress Notes (Signed)
CC: Jake Clarke is a 73 y.o. male is here for Personal Problem   Subjective: HPI:  Complains of worsening erectile dysfunction. Moderate severity seems to be problematic over the past 2 months. Described as difficulty with keeping and initiating erections. Denies any other genitourinary complaints. He reports no change in libido and still has desire for sexual activity. Denies anxiety or depression. Denies fevers, chills, penile discharge, dysuria, testicular pain, abdominal pain, chest pain, nor limb claudication. Wakes up 0-1 times a night to urinate, and denies weak stream, denies polyuria, denies urinary urgency   Review Of Systems Outlined In HPI  Past Medical History  Diagnosis Date  . Essential hypertension, malignant   . Pure hypercholesterolemia   . Other abnormal blood chemistry   . Unspecified hypothyroidism   . Impotence of organic origin   . Urinary tract infection, site not specified   . Unspecified hemorrhoids without mention of complication   . Prostatic hypertrophy, benign, with obstruction     No past surgical history on file. Family History  Problem Relation Age of Onset  . Kidney disease Father   . Heart disease Sister   . Hypertension Mother   . Cancer Daughter     History   Social History  . Marital Status: Married    Spouse Name: N/A    Number of Children: N/A  . Years of Education: N/A   Occupational History  . Not on file.   Social History Main Topics  . Smoking status: Never Smoker   . Smokeless tobacco: Former Systems developer  . Alcohol Use: No  . Drug Use: No  . Sexual Activity: Yes   Other Topics Concern  . Not on file   Social History Narrative  . No narrative on file     Objective: BP 164/91  Pulse 71  Wt 210 lb (95.255 kg)  Vital signs reviewed. General: Alert and Oriented, No Acute Distress HEENT: Pupils equal, round, reactive to light. Conjunctivae clear.  External ears unremarkable.  Moist mucous membranes. Lungs: Clear and  comfortable work of breathing, speaking in full sentences without accessory muscle use. Cardiac: Regular rate and rhythm.  Neuro: CN II-XII grossly intact, gait normal. Extremities: No peripheral edema.  Strong peripheral pulses.  Mental Status: No depression, anxiety, nor agitation. Logical though process. Skin: Warm and dry.  Assessment & Plan: Jake Clarke was seen today for personal problem.  Diagnoses and associated orders for this visit:  BPH (benign prostatic hypertrophy) with urinary obstruction  Erectile dysfunction - Testosterone, free, total    BPH: Stable however it sounds like finasteride now causing impotence that is significantly interfering with quality of life. We will change this dose to every other day dosing. He has many wealth questions regarding his other medications could be causing this however finasteride is definitely the most likely culprit. He would like to know if he can have his testosterone checked, we will use this to see if this is also contributing to rectal dysfunction however I do not think he is a candidate for replacement therapy given his elevated PSAs.  25 minutes spent face-to-face during visit today of which at least 50% was counseling or coordinating care regarding: 1. BPH (benign prostatic hypertrophy) with urinary obstruction   2. Erectile dysfunction       Return if symptoms worsen or fail to improve.

## 2013-11-23 LAB — TESTOSTERONE, FREE, TOTAL, SHBG
Sex Hormone Binding: 57 nmol/L (ref 13–71)
TESTOSTERONE-% FREE: 1.5 % — AB (ref 1.6–2.9)
TESTOSTERONE: 516 ng/dL (ref 300–890)
Testosterone, Free: 75.1 pg/mL (ref 47.0–244.0)

## 2014-03-12 ENCOUNTER — Encounter: Payer: Self-pay | Admitting: Family Medicine

## 2014-03-12 ENCOUNTER — Other Ambulatory Visit: Payer: Self-pay | Admitting: Family Medicine

## 2014-03-21 ENCOUNTER — Other Ambulatory Visit: Payer: Self-pay | Admitting: Family Medicine

## 2014-03-27 ENCOUNTER — Other Ambulatory Visit: Payer: Self-pay | Admitting: *Deleted

## 2014-03-27 MED ORDER — TADALAFIL 5 MG PO TABS: 5.0000 mg | ORAL_TABLET | Freq: Every day | ORAL | Status: DC | PRN

## 2014-03-28 ENCOUNTER — Encounter: Payer: Self-pay | Admitting: Family Medicine

## 2014-05-13 ENCOUNTER — Telehealth: Payer: Self-pay | Admitting: *Deleted

## 2014-05-13 MED ORDER — TAMSULOSIN HCL 0.4 MG PO CAPS: ORAL_CAPSULE | ORAL | Status: DC

## 2014-05-13 NOTE — Telephone Encounter (Signed)
Refill requested

## 2014-05-30 ENCOUNTER — Ambulatory Visit (INDEPENDENT_AMBULATORY_CARE_PROVIDER_SITE_OTHER): Payer: Medicare Other | Admitting: Sports Medicine

## 2014-05-30 VITALS — Temp 97.5°F

## 2014-05-30 DIAGNOSIS — Z23 Encounter for immunization: Secondary | ICD-10-CM

## 2014-05-30 NOTE — Assessment & Plan Note (Signed)
Influenza vaccine as above.

## 2014-05-30 NOTE — Progress Notes (Signed)
   Subjective:    Patient ID: Jake Clarke, male    DOB: [birth year], 73 y.o.   MRN: [id number]  HPI  Boyd is here for a flu vaccine.   Review of Systems     Objective:   Physical Exam        Assessment & Plan:  Patient tolerated injection well without complications.

## 2014-07-04 ENCOUNTER — Encounter (HOSPITAL_COMMUNITY): Payer: Self-pay | Admitting: Vascular Surgery

## 2014-07-11 ENCOUNTER — Other Ambulatory Visit: Payer: Self-pay | Admitting: Family Medicine

## 2014-07-19 ENCOUNTER — Other Ambulatory Visit: Payer: Self-pay | Admitting: Family Medicine

## 2014-08-09 ENCOUNTER — Ambulatory Visit (INDEPENDENT_AMBULATORY_CARE_PROVIDER_SITE_OTHER): Payer: Medicare Other | Admitting: Family Medicine

## 2014-08-09 ENCOUNTER — Telehealth: Payer: Self-pay

## 2014-08-09 ENCOUNTER — Encounter: Payer: Self-pay | Admitting: Family Medicine

## 2014-08-09 VITALS — BP 181/99 | HR 69 | Ht 72.0 in | Wt 212.0 lb

## 2014-08-09 DIAGNOSIS — Z23 Encounter for immunization: Secondary | ICD-10-CM

## 2014-08-09 DIAGNOSIS — Z125 Encounter for screening for malignant neoplasm of prostate: Secondary | ICD-10-CM

## 2014-08-09 DIAGNOSIS — Z79899 Other long term (current) drug therapy: Secondary | ICD-10-CM | POA: Diagnosis not present

## 2014-08-09 DIAGNOSIS — Z0000 Encounter for general adult medical examination without abnormal findings: Secondary | ICD-10-CM

## 2014-08-09 LAB — COMPLETE METABOLIC PANEL WITH GFR
ALBUMIN: 3.7 g/dL (ref 3.5–5.2)
ALT: 14 U/L (ref 0–53)
AST: 21 U/L (ref 0–37)
Alkaline Phosphatase: 63 U/L (ref 39–117)
BUN: 13 mg/dL (ref 6–23)
CALCIUM: 9 mg/dL (ref 8.4–10.5)
CO2: 28 mEq/L (ref 19–32)
Chloride: 102 mEq/L (ref 96–112)
Creat: 1.23 mg/dL (ref 0.50–1.35)
GFR, EST AFRICAN AMERICAN: 66 mL/min
GFR, Est Non African American: 57 mL/min — ABNORMAL LOW
Glucose, Bld: 98 mg/dL (ref 70–99)
Potassium: 4.3 mEq/L (ref 3.5–5.3)
SODIUM: 138 meq/L (ref 135–145)
TOTAL PROTEIN: 7.3 g/dL (ref 6.0–8.3)
Total Bilirubin: 0.9 mg/dL (ref 0.2–1.2)

## 2014-08-09 LAB — CBC
HCT: 35.9 % — ABNORMAL LOW (ref 39.0–52.0)
Hemoglobin: 12.4 g/dL — ABNORMAL LOW (ref 13.0–17.0)
MCH: 30 pg (ref 26.0–34.0)
MCHC: 34.5 g/dL (ref 30.0–36.0)
MCV: 86.7 fL (ref 78.0–100.0)
MPV: 9.5 fL (ref 8.6–12.4)
PLATELETS: 271 10*3/uL (ref 150–400)
RBC: 4.14 MIL/uL — ABNORMAL LOW (ref 4.22–5.81)
RDW: 14.2 % (ref 11.5–15.5)
WBC: 3.5 10*3/uL — ABNORMAL LOW (ref 4.0–10.5)

## 2014-08-09 LAB — LIPID PANEL
CHOL/HDL RATIO: 4.7 ratio
Cholesterol: 206 mg/dL — ABNORMAL HIGH (ref 0–200)
HDL: 44 mg/dL (ref >39)
LDL CALC: 147 mg/dL — AB (ref 0–99)
TRIGLYCERIDES: 74 mg/dL (ref <150)
VLDL: 15 mg/dL (ref 0–40)

## 2014-08-09 LAB — TSH: TSH: 6.104 u[IU]/mL — ABNORMAL HIGH (ref 0.350–4.500)

## 2014-08-09 MED ORDER — SILDENAFIL CITRATE 20 MG PO TABS: 20.0000 mg | ORAL_TABLET | Freq: Every day | ORAL | Status: DC | PRN

## 2014-08-09 NOTE — Addendum Note (Signed)
Addended by: Isaias Cowman C on: 08/09/2014 10:15 AM   Modules accepted: Orders

## 2014-08-09 NOTE — Telephone Encounter (Signed)
Patient called and left a message on nurse line asking for a return call.   Returned Call: Left message asking patient to call back.  

## 2014-08-09 NOTE — Progress Notes (Signed)
Subjective:    Jake Clarke is a 74 y.o. male who presents for Medicare Annual/Subsequent preventive examination.   Preventive Screening-Counseling & Management  Tobacco History  Smoking status  . Never Smoker   Smokeless tobacco  . Former User   Colonoscopy: Normal 2009 with 10 year clearance Prostate: Discussed screening risks/beneifts with patient today, due for PSA.  Influenza Vaccine: UTD Pneumovax: needs prevnar, will receive today    Problems Prior to Visit 1. Erectile dysfunction erectile dysfunction: Unable to afford Cialis, still having difficulty initiating and maintaining an erection during all sexual encounters. Still has high libido. States that life would be great if he had the ability to have sex.  Current Problems (verified) Patient Active Problem List   Diagnosis Date Noted  . Elevated PSA 07/27/2013  . Aneurysm 03/28/2013  . Essential hypertension 08/21/2012  . Erectile dysfunction 08/09/2012  . Polyuria 08/09/2012  . Hyperlipidemia LDL goal < 130 05/04/2012  . 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 Hypothyroid 05/04/2012  . Immunization due 05/04/2012    Medications Prior to Visit Current Outpatient Prescriptions on File Prior to Visit  Medication Sig Dispense Refill  . Calcium Carbonate (CALTRATE 600 PO) Take 600 mg by mouth daily.     . finasteride (PROSCAR) 5 MG tablet Take 1 tablet (5 mg total) by mouth every other day. 30 tablet 11  . levothyroxine (SYNTHROID, LEVOTHROID) 50 MCG tablet TAKE 1 TABLET BY MOUTH EVERY DAY 30 tablet 3  . Multiple Vitamins-Minerals (CENTRUM SILVER PO) Take 1 tablet by mouth daily.     . tamsulosin (FLOMAX) 0.4 MG CAPS capsule TAKE 2 CAPSULES (0.8 MG TOTAL) BY MOUTH DAILY. 60 capsule 1   No current facility-administered medications on file prior to visit.    Current Medications (verified) Current Outpatient Prescriptions  Medication Sig Dispense Refill  . Calcium Carbonate (CALTRATE 600  PO) Take 600 mg by mouth daily.     . finasteride (PROSCAR) 5 MG tablet Take 1 tablet (5 mg total) by mouth every other day. 30 tablet 11  . levothyroxine (SYNTHROID, LEVOTHROID) 50 MCG tablet TAKE 1 TABLET BY MOUTH EVERY DAY 30 tablet 3  . Multiple Vitamins-Minerals (CENTRUM SILVER PO) Take 1 tablet by mouth daily.     . tamsulosin (FLOMAX) 0.4 MG CAPS capsule TAKE 2 CAPSULES (0.8 MG TOTAL) BY MOUTH DAILY. 60 capsule 1  . sildenafil (REVATIO) 20 MG tablet Take 1 tablet (20 mg total) by mouth daily as needed (sex). 50 tablet 1   No current facility-administered medications for this visit.     Allergies (verified) Review of patient's allergies indicates no known allergies.   PAST HISTORY  Family History Family History  Problem Relation Age of Onset  . Kidney disease Father   . Heart disease Sister   . Hypertension Mother   . Cancer Daughter     Social History History  Substance Use Topics  . Smoking status: Never Smoker   . Smokeless tobacco: Former Systems developer  . Alcohol Use: No    Are there smokers in your home (other than you)?  No  Risk Factors Current exercise habits: The patient does not participate in regular exercise at present.  Dietary issues discussed: DASH diet   Cardiac risk factors: advanced age (older than 37 for men, 33 for women), dyslipidemia, hypertension, male gender and sedentary lifestyle.  Depression Screen (Note: if answer to either of the following is "Yes", a more complete depression screening is indicated)   Q1: Over the  past two weeks, have you felt down, depressed or hopeless? No  Q2: Over the past two weeks, have you felt little interest or pleasure in doing things? No  Have you lost interest or pleasure in daily life? No  Do you often feel hopeless? No  Do you cry easily over simple problems? No  Activities of Daily Living In your present state of health, do you have any difficulty performing the following activities?:  Driving? No Managing  money?  No Feeding yourself? No Getting from bed to chair? No Climbing a flight of stairs? No Preparing food and eating?: No Bathing or showering? No Getting dressed: No Getting to the toilet? No Using the toilet:No Moving around from place to place: No In the past year have you fallen or had a near fall?:No   Are you sexually active?  Yes  Do you have more than one partner?  No  Hearing Difficulties: No Do you often ask people to speak up or repeat themselves? No Do you experience ringing or noises in your ears? No Do you have difficulty understanding soft or whispered voices? No   Do you feel that you have a problem with memory? No  Do you often misplace items? No  Do you feel safe at home?  No  Cognitive Testing  Alert? Yes  Normal Appearance?Yes  Oriented to person? Yes  Place? Yes   Time? Yes  Recall of three objects?  Yes  Can perform simple calculations? Yes  Displays appropriate judgment?Yes  Can read the correct time from a watch face?Yes   Advanced Directives have been discussed with the patient? Yes   List the Names of Other Physician/Practitioners you currently use: 1.    Indicate any recent Medical Services you may have received from other than [facility] providers in the past year (date may be approximate).  Immunization History  Administered Date(s) Administered  . Influenza Split 05/04/2012  . Influenza,inj,Quad PF,36+ Mos 05/18/2013, 05/30/2014  . Pneumococcal-Unspecified 07/27/2011    Screening Tests Health Maintenance  Topic Date Due  . TETANUS/TDAP  03/09/1958  . ZOSTAVAX  03/10/1999  . INFLUENZA VACCINE  02/24/2015  . COLONOSCOPY  09/21/2017  . PNEUMOCOCCAL POLYSACCHARIDE VACCINE AGE 65 AND OVER  Completed    All answers were reviewed with the patient and necessary referrals were made:  Marcial Pacas, DO   08/09/2014   History reviewed: allergies, current medications, past family history, past medical history, past social history, past  surgical history and problem list  Review of Systems Review of Systems - General ROS: negative for - chills, fever, night sweats, weight gain or weight loss Ophthalmic ROS: negative for - decreased vision Psychological ROS: negative for - anxiety or depression ENT ROS: negative for - hearing change, nasal congestion, tinnitus or allergies Hematological and Lymphatic ROS: negative for - bleeding problems, bruising or swollen lymph nodes Breast ROS: negative Respiratory ROS: no cough, shortness of breath, or wheezing Cardiovascular ROS: no chest pain or dyspnea on exertion Gastrointestinal ROS: no abdominal pain, change in bowel habits, or black or bloody stools Genito-Urinary ROS: negative for - genital discharge, genital ulcers, incontinence or abnormal bleeding from genitals Musculoskeletal ROS: negative for - joint pain or muscle pain Neurological ROS: negative for - headaches or memory loss Dermatological ROS: negative for lumps, mole changes, rash and skin lesion changes   Objective:     Vision by Snellen chart: right eye:20/30, left eye:20/30 Blood pressure 181/99, pulse 69, height 6' (1.829 m), weight 212 lb (96.163  kg). Body mass index is 28.75 kg/(m^2).  General: No Acute Distress HEENT: Atraumatic, normocephalic, conjunctivae normal without scleral icterus.  No nasal discharge, hearing grossly intact, TMs with good landmarks bilaterally with no middle ear abnormalities, posterior pharynx clear without oral lesions. Neck: Supple, trachea midline, no cervical nor supraclavicular adenopathy. Pulmonary: Clear to auscultation bilaterally without wheezing, rhonchi, nor rales. Cardiac: Regular rate and rhythm.  No murmurs, rubs, nor gallops. No peripheral edema.  2+ peripheral pulses bilaterally. Abdomen: Bowel sounds normal.  No masses.  Non-tender without rebound.  Negative Murphy's sign. GU: Bilateral descended testes no inguinal hernias  MSK: Grossly intact, no signs of weakness.   Full strength throughout upper and lower extremities.  Full ROM in upper and lower extremities.  No midline spinal tenderness. Neuro: Gait unremarkable, CN II-XII grossly intact.  C5-C6 Reflex 2/4 Bilaterally, L4 Reflex 2/4 Bilaterally.  Cerebellar function intact. Skin: No rashes. Psych: Alert and oriented to person/place/time.  Thought process normal. No anxiety/depression.   Assessment:     Normal physical exam with erectile dysfunction interfering with quality of life     Plan:     During the course of the visit the patient was educated and counseled about appropriate screening and preventive services including:    Pneumococcal vaccine   Prostate cancer screening  Nutrition counseling   Diet review for nutrition referral? Not indicated  Provided with generic form of Viagra to be picked up at [facility] drug   Patient Instructions (the written plan) was given to the patient.  Medicare Attestation I have personally reviewed: The patient's medical and social history Their use of alcohol, tobacco or illicit drugs Their current medications and supplements The patient's functional ability including ADLs,fall risks, home safety risks, cognitive, and hearing and visual impairment Diet and physical activities Evidence for depression or mood disorders  The patient's weight, height, BMI, and visual acuity have been recorded in the chart.  I have made referrals, counseling, and provided education to the patient based on review of the above and I have provided the patient with a written personalized care plan for preventive services.     Marcial Pacas, DO   08/09/2014

## 2014-08-09 NOTE — Patient Instructions (Signed)
Dr. Saleah Rishel's General Advice Following Your Complete Physical Exam  The Benefits of Regular Exercise: Unless you suffer from an uncontrolled cardiovascular condition, studies strongly suggest that regular exercise and physical activity will add to both the quality and length of your life.  The World Health Organization recommends 150 minutes of moderate intensity aerobic activity every week.  This is best split over 3-4 days a week, and can be as simple as a brisk walk for just over 35 minutes "most days of the week".  This type of exercise has been shown to lower LDL-Cholesterol, lower average blood sugars, lower blood pressure, lower cardiovascular disease risk, improve memory, and increase one's overall sense of wellbeing.  The addition of anaerobic (or "strength training") exercises offers additional benefits including but not limited to increased metabolism, prevention of osteoporosis, and improved overall cholesterol levels.  How Can I Strive For A Low-Fat Diet?: Current guidelines recommend that 25-35 percent of your daily energy (food) intake should come from fats.  One might ask how can this be achieved without having to dissect each meal on a daily basis?  Switch to skim or 1% milk instead of whole milk.  Focus on lean meats such as ground turkey, fresh fish, baked chicken, and lean cuts of beef as your source of dietary protein.  Limit saturated fat consumption to less than 10% of your daily caloric intake.  Limit trans fatty acid consumption primarily by limiting synthetic trans fats such as partially hydrogenated oils (Ex: fried fast foods).  Substitute olive or vegetable oil for solid fats where possible.  Moderation of Salt Intake: Provided you don't carry a diagnosis of congestive heart failure nor renal failure, I recommend a daily allowance of no more than 2300 mg of salt (sodium).  Keeping under this daily goal is associated with a decreased risk of cardiovascular events, creeping  above it can lead to elevated blood pressures and increases your risk of cardiovascular events.  Milligrams (mg) of salt is listed on all nutrition labels, and your daily intake can add up faster than you think.  Most canned and frozen dinners can pack in over half your daily salt allowance in one meal.    Lifestyle Health Risks: Certain lifestyle choices carry specific health risks.  As you may already know, tobacco use has been associated with increasing one's risk of cardiovascular disease, pulmonary disease, numerous cancers, among many other issues.  What you may not know is that there are medications and nicotine replacement strategies that can more than double your chances of successfully quitting.  I would be thrilled to help manage your quitting strategy if you currently use tobacco products.  When it comes to alcohol use, I've yet to find an "ideal" daily allowance.  Provided an individual does not have a medical condition that is exacerbated by alcohol consumption, general guidelines determine "safe drinking" as no more than two standard drinks for a man or no more than one standard drink for a male per day.  However, much debate still exists on whether any amount of alcohol consumption is technically "safe".  My general advice, keep alcohol consumption to a minimum for general health promotion.  If you or others believe that alcohol, tobacco, or recreational drug use is interfering with your life, I would be happy to provide confidential counseling regarding treatment options.  General "Over The Counter" Nutrition Advice: Postmenopausal women should aim for a daily calcium intake of 1200 mg, however a significant portion of this might already be   provided by diets including milk, yogurt, cheese, and other dairy products.  Vitamin D has been shown to help preserve bone density, prevent fatigue, and has even been shown to help reduce falls in the elderly.  Ensuring a daily intake of 800 Units of  Vitamin D is a good place to start to enjoy the above benefits, we can easily check your Vitamin D level to see if you'd potentially benefit from supplementation beyond 800 Units a day.  Folic Acid intake should be of particular concern to women of childbearing age.  Daily consumption of 400-800 mcg of Folic Acid is recommended to minimize the chance of spinal cord defects in a fetus should pregnancy occur.    For many adults, accidents still remain one of the most common culprits when it comes to cause of death.  Some of the simplest but most effective preventitive habits you can adopt include regular seatbelt use, proper helmet use, securing firearms, and regularly testing your smoke and carbon monoxide detectors.  Jake Liddy [address] DO [facility] [address], [address] [place], [place] [address] Phone: [phone]  

## 2014-08-10 LAB — PSA: PSA: 2.46 ng/mL (ref <=4.00)

## 2014-08-12 ENCOUNTER — Telehealth: Payer: Self-pay | Admitting: Family Medicine

## 2014-08-12 DIAGNOSIS — E785 Hyperlipidemia, unspecified: Secondary | ICD-10-CM

## 2014-08-12 MED ORDER — ATORVASTATIN CALCIUM 20 MG PO TABS: 20.0000 mg | ORAL_TABLET | Freq: Every day | ORAL | Status: DC

## 2014-08-12 MED ORDER — LEVOTHYROXINE SODIUM 75 MCG PO TABS: 75.0000 ug | ORAL_TABLET | Freq: Every day | ORAL | Status: DC

## 2014-08-12 NOTE — Telephone Encounter (Signed)
Seth Bake, Will you please let patient know that his cholesterol was elevated to a degree that I would recommend starting a cholesterol lowering medication called atorvastatin.  I've [facility].  Also, his thyroid supplementation appears to be under-dosed therefore I've sent in a new dosage at 53mcg.  His PSA is back in the normal range.  All other tests were normal.  F/U in one month for BP check.

## 2014-08-12 NOTE — Telephone Encounter (Signed)
Pt advised.

## 2014-09-19 ENCOUNTER — Telehealth: Payer: Self-pay | Admitting: *Deleted

## 2014-09-19 MED ORDER — DICLOFENAC SODIUM 50 MG PO TBEC: DELAYED_RELEASE_TABLET | ORAL | Status: DC

## 2014-09-19 NOTE — Telephone Encounter (Signed)
Jake Clarke, Diclofenac sent to [facility]

## 2014-09-19 NOTE — Telephone Encounter (Signed)
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 He stated that he did not want to because of the meds that he was on. I looked at his med list and allergy list and it didn't look like there would be any problems with him trying the tylenol for his arthritis pain. He stated that he would like for Dr. Ileene Rubens to send something into his pharmacy for him to take for this. I told him that I would fwd this to his pcp.Elouise Munroe'

## 2014-09-19 NOTE — Telephone Encounter (Signed)
Pt informed.Jake Clarke Lynetta  

## 2014-10-04 ENCOUNTER — Other Ambulatory Visit: Payer: Self-pay | Admitting: Family Medicine

## 2014-11-14 ENCOUNTER — Other Ambulatory Visit: Payer: Self-pay | Admitting: Family Medicine

## 2014-11-23 ENCOUNTER — Other Ambulatory Visit: Payer: Self-pay | Admitting: Family Medicine

## 2015-01-09 ENCOUNTER — Other Ambulatory Visit: Payer: Self-pay | Admitting: Family Medicine

## 2015-02-03 ENCOUNTER — Other Ambulatory Visit: Payer: Self-pay | Admitting: Family Medicine

## 2015-03-14 ENCOUNTER — Other Ambulatory Visit: Payer: Self-pay | Admitting: Family Medicine

## 2015-05-01 DIAGNOSIS — H04123 Dry eye syndrome of bilateral lacrimal glands: Secondary | ICD-10-CM | POA: Diagnosis not present

## 2015-05-01 DIAGNOSIS — H02054 Trichiasis without entropian left upper eyelid: Secondary | ICD-10-CM | POA: Diagnosis not present

## 2015-05-01 DIAGNOSIS — H2513 Age-related nuclear cataract, bilateral: Secondary | ICD-10-CM | POA: Diagnosis not present

## 2015-05-01 DIAGNOSIS — H5441 Blindness, right eye, normal vision left eye: Secondary | ICD-10-CM | POA: Diagnosis not present

## 2015-05-01 DIAGNOSIS — H31001 Unspecified chorioretinal scars, right eye: Secondary | ICD-10-CM | POA: Diagnosis not present

## 2015-05-19 ENCOUNTER — Other Ambulatory Visit: Payer: Self-pay | Admitting: Family Medicine

## 2015-06-05 ENCOUNTER — Other Ambulatory Visit: Payer: Self-pay | Admitting: Family Medicine

## 2015-06-10 ENCOUNTER — Encounter: Payer: Self-pay | Admitting: Family Medicine

## 2015-06-10 ENCOUNTER — Ambulatory Visit (INDEPENDENT_AMBULATORY_CARE_PROVIDER_SITE_OTHER): Payer: Medicare Other | Admitting: Family Medicine

## 2015-06-10 VITALS — BP 161/89 | HR 63 | Wt 209.0 lb

## 2015-06-10 DIAGNOSIS — I10 Essential (primary) hypertension: Secondary | ICD-10-CM | POA: Diagnosis not present

## 2015-06-10 DIAGNOSIS — N401 Enlarged prostate with lower urinary tract symptoms: Secondary | ICD-10-CM

## 2015-06-10 DIAGNOSIS — N138 Other obstructive and reflux uropathy: Secondary | ICD-10-CM

## 2015-06-10 MED ORDER — DOXAZOSIN MESYLATE 4 MG PO TABS: 4.0000 mg | ORAL_TABLET | Freq: Every day | ORAL | Status: DC

## 2015-06-10 MED ORDER — TAMSULOSIN HCL 0.4 MG PO CAPS: ORAL_CAPSULE | ORAL | Status: DC

## 2015-06-10 NOTE — Progress Notes (Signed)
CC: Jake Clarke is a 74 y.o. male is here for Medication Refill and Hypertension   Subjective: HPI:  Follow-up BPH: He ran out of Flomax a few weeks ago, even when he was taking this he was waking 3-4 times a night to urinate. He denies any daytime symptoms. He continues on finasteride. He denies any dysuria or pelvic pain.  Follow essential hypertension: His walking most days out of the week to exercise. He is trying his best to reduce sodium in his diet. Currently not taking any antihypertensive medication. Denies chest pain Shortness of breath orthopnea nor peripheral edema    Review Of Systems Outlined In HPI  Past Medical History  Diagnosis Date  . Essential hypertension, malignant   . Pure hypercholesterolemia   . Other abnormal blood chemistry   . Unspecified hypothyroidism   . Impotence of organic origin   . Urinary tract infection, site not specified   . Unspecified hemorrhoids without mention of complication   . Prostatic hypertrophy, benign, with obstruction     Past Surgical History  Procedure Laterality Date  . Bilateral upper extremity angiogram Right 04/05/2013    Procedure: UPPER EXTREMITY;  Surgeon: Conrad Lynch, MD;  Location: [facility] CATH LAB;  Service: Cardiovascular;  Laterality: Right;  . Arch aortogram  04/05/2013    Procedure: ARCH AORTOGRAM;  Surgeon: Conrad Estacada, MD;  Location: [facility] CATH LAB;  Service: Cardiovascular;;   Family History  Problem Relation Age of Onset  . Kidney disease Father   . Heart disease Sister   . Hypertension Mother   . Cancer Daughter     Social History   Social History  . Marital Status: Married    Spouse Name: N/A  . Number of Children: N/A  . Years of Education: N/A   Occupational History  . Not on file.   Social History Main Topics  . Smoking status: Never Smoker   . Smokeless tobacco: Former Systems developer  . Alcohol Use: No  . Drug Use: No  . Sexual Activity: Yes   Other Topics Concern  . Not on file   Social  History Narrative     Objective: BP 161/89 mmHg  Pulse 63  Wt 209 lb (94.802 kg)  General: Alert and Oriented, No Acute Distress HEENT: Pupils equal, round, reactive to light. Conjunctivae clear. Moist mucous membranes  Lungs: Clear to auscultation bilaterally, no wheezing/ronchi/rales.  Comfortable work of breathing. Good air movement. Cardiac: Regular rate and rhythm. Normal S1/S2.  No murmurs, rubs, nor gallops.   Extremities: No peripheral edema.  Strong peripheral pulses.  Mental Status: No depression, anxiety, nor agitation. Skin: Warm and dry.  Assessment & Plan: Jake Clarke was seen today for medication refill and hypertension.  Diagnoses and all orders for this visit:  Essential hypertension  Prostate hyperplasia with urinary obstruction -     tamsulosin (FLOMAX) 0.4 MG CAPS capsule; Two by mouth daily. -     doxazosin (CARDURA) 4 MG tablet; Take 1 tablet (4 mg total) by mouth daily.    Essential hypertension: Uncontrolled chronic condition, starting doxazosin which should also help with BPH.   BPH: Uncontrolled chronic condition, adding doxazosin which should also help with blood pressure.    Return in about 2 months (around 08/11/2015).

## 2015-06-10 NOTE — Patient Instructions (Signed)
You will be due for an annual physical and  Blood work on or after January 16th 2017

## 2015-06-11 ENCOUNTER — Telehealth: Payer: Self-pay

## 2015-06-11 NOTE — Telephone Encounter (Signed)
Pharmacy needs clarification.  Is Jake Clarke suppose to be taking both Flomax and Cardura?  When he went to pick up meds he told pharmacist that he was suppose to be getting a better medication than Flomax.  Both were filled yesterday.

## 2015-06-11 NOTE — Telephone Encounter (Signed)
Pharmacy advised  

## 2015-06-11 NOTE — Telephone Encounter (Signed)
Yes both of these are intended to be prescribed for difficult to treat BPH.

## 2015-06-16 ENCOUNTER — Telehealth: Payer: Self-pay

## 2015-06-16 NOTE — Telephone Encounter (Signed)
Patient wants to know Dr Hommel's opinion; what time of day should he should take Cardura and Flomax? Please advise.

## 2015-06-16 NOTE — Telephone Encounter (Signed)
At bedtime would be best

## 2015-06-16 NOTE — Telephone Encounter (Signed)
Pt advised of recommendation.  Verbalized understanding.

## 2015-08-12 ENCOUNTER — Encounter: Payer: Medicare Other | Admitting: Family Medicine

## 2015-08-13 ENCOUNTER — Other Ambulatory Visit: Payer: Self-pay | Admitting: Family Medicine

## 2015-08-15 ENCOUNTER — Encounter: Payer: Self-pay | Admitting: Family Medicine

## 2015-08-15 ENCOUNTER — Ambulatory Visit (INDEPENDENT_AMBULATORY_CARE_PROVIDER_SITE_OTHER): Payer: Medicare Other | Admitting: Family Medicine

## 2015-08-15 VITALS — BP 150/83 | HR 67 | Wt 214.0 lb

## 2015-08-15 DIAGNOSIS — E039 Hypothyroidism, unspecified: Secondary | ICD-10-CM

## 2015-08-15 DIAGNOSIS — R972 Elevated prostate specific antigen [PSA]: Secondary | ICD-10-CM

## 2015-08-15 DIAGNOSIS — I10 Essential (primary) hypertension: Secondary | ICD-10-CM | POA: Diagnosis not present

## 2015-08-15 DIAGNOSIS — Z23 Encounter for immunization: Secondary | ICD-10-CM | POA: Diagnosis not present

## 2015-08-15 DIAGNOSIS — Z0000 Encounter for general adult medical examination without abnormal findings: Secondary | ICD-10-CM

## 2015-08-15 DIAGNOSIS — J329 Chronic sinusitis, unspecified: Secondary | ICD-10-CM

## 2015-08-15 DIAGNOSIS — E785 Hyperlipidemia, unspecified: Secondary | ICD-10-CM | POA: Diagnosis not present

## 2015-08-15 DIAGNOSIS — N401 Enlarged prostate with lower urinary tract symptoms: Secondary | ICD-10-CM

## 2015-08-15 DIAGNOSIS — N138 Other obstructive and reflux uropathy: Secondary | ICD-10-CM

## 2015-08-15 MED ORDER — DOXAZOSIN MESYLATE 8 MG PO TABS: 8.0000 mg | ORAL_TABLET | Freq: Every day | ORAL | Status: DC

## 2015-08-15 MED ORDER — SILDENAFIL CITRATE 100 MG PO TABS: 50.0000 mg | ORAL_TABLET | Freq: Every day | ORAL | Status: DC | PRN

## 2015-08-15 MED ORDER — METHYLPREDNISOLONE ACETATE 80 MG/ML IJ SUSP
80.0000 mg | Freq: Once | INTRAMUSCULAR | Status: AC
  Administered 2015-08-15: 80 mg via INTRAMUSCULAR

## 2015-08-15 NOTE — Progress Notes (Signed)
Subjective:    Jake Clarke is a 75 y.o. male who presents for Medicare Annual/Subsequent preventive examination.   Preventive Screening-Counseling & Management  Tobacco History  Smoking status  . Never Smoker   Smokeless tobacco  . Former Systems developer    Problems Prior to Visit 1. HTN, BPH, Hypothyroidism  Colonoscopy: UTD from 08/2007, repeat 2019 Prostate: Discussed screening risks/beneifts with patient today, obtaining BSP  Influenza Vaccine: UTD Pneumovax: UTD x 2 Td/Tdap: due today Zoster: (Start 75 yo)    Current Problems (verified) Patient Active Problem List   Diagnosis Date Noted  . Elevated PSA 07/27/2013  . Aneurysm ([place]) 03/28/2013  . Essential hypertension 08/21/2012  . Erectile dysfunction 08/09/2012  . Polyuria 08/09/2012  . Hyperlipidemia 05/04/2012  . 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 Hypothyroid 05/04/2012  . Immunization due 05/04/2012    Medications Prior to Visit Current Outpatient Prescriptions on File Prior to Visit  Medication Sig Dispense Refill  . atorvastatin (LIPITOR) 20 MG tablet TAKE 1 TABLET (20 MG TOTAL) BY MOUTH DAILY. 90 tablet 3  . Calcium Carbonate (CALTRATE 600 PO) Take 600 mg by mouth daily.     . diclofenac (VOLTAREN) 50 MG EC tablet TAKE ONE TABLET EVERY 8 HOURS ONLY AS NEEDED FOR PAIN, TAKE WITH SMALL SNACK. 45 tablet 1  . doxazosin (CARDURA) 4 MG tablet Take 1 tablet (4 mg total) by mouth daily. 90 tablet 1  . finasteride (PROSCAR) 5 MG tablet TAKE 1 TABLET (5 MG TOTAL) BY MOUTH DAILY. 30 tablet 11  . levothyroxine (SYNTHROID, LEVOTHROID) 75 MCG tablet TAKE 1 TABLET (75 MCG TOTAL) BY MOUTH DAILY. 90 tablet 1  . Multiple Vitamins-Minerals (CENTRUM SILVER PO) Take 1 tablet by mouth daily.     . sildenafil (REVATIO) 20 MG tablet Take 1 tablet (20 mg total) by mouth daily as needed (sex). 50 tablet 1  . tamsulosin (FLOMAX) 0.4 MG CAPS capsule Two by mouth daily. 90 capsule 1   No current  facility-administered medications on file prior to visit.    Current Medications (verified) Current Outpatient Prescriptions  Medication Sig Dispense Refill  . atorvastatin (LIPITOR) 20 MG tablet TAKE 1 TABLET (20 MG TOTAL) BY MOUTH DAILY. 90 tablet 3  . Calcium Carbonate (CALTRATE 600 PO) Take 600 mg by mouth daily.     . diclofenac (VOLTAREN) 50 MG EC tablet TAKE ONE TABLET EVERY 8 HOURS ONLY AS NEEDED FOR PAIN, TAKE WITH SMALL SNACK. 45 tablet 1  . doxazosin (CARDURA) 4 MG tablet Take 1 tablet (4 mg total) by mouth daily. 90 tablet 1  . finasteride (PROSCAR) 5 MG tablet TAKE 1 TABLET (5 MG TOTAL) BY MOUTH DAILY. 30 tablet 11  . levothyroxine (SYNTHROID, LEVOTHROID) 75 MCG tablet TAKE 1 TABLET (75 MCG TOTAL) BY MOUTH DAILY. 90 tablet 1  . Multiple Vitamins-Minerals (CENTRUM SILVER PO) Take 1 tablet by mouth daily.     . sildenafil (REVATIO) 20 MG tablet Take 1 tablet (20 mg total) by mouth daily as needed (sex). 50 tablet 1  . tamsulosin (FLOMAX) 0.4 MG CAPS capsule Two by mouth daily. 90 capsule 1   No current facility-administered medications for this visit.     Allergies (verified) Review of patient's allergies indicates no known allergies.   PAST HISTORY  Family History Family History  Problem Relation Age of Onset  . Kidney disease Father   . Heart disease Sister   . Hypertension Mother   . Cancer Daughter     Social History  Social History  Substance Use Topics  . Smoking status: Never Smoker   . Smokeless tobacco: Former Systems developer  . Alcohol Use: No    Are there smokers in your home (other than you)?  No  Risk Factors Current exercise habits: Home exercise routine includes walking.  Dietary issues discussed: DASH   Cardiac risk factors: advanced age (older than 66 for men, 3 for women), dyslipidemia and hypertension.  Depression Screen (Note: if answer to either of the following is "Yes", a more complete depression screening is indicated)   Q1: Over the past two  weeks, have you felt down, depressed or hopeless? No  Q2: Over the past two weeks, have you felt little interest or pleasure in doing things? No  Have you lost interest or pleasure in daily life? No  Do you often feel hopeless? No  Do you cry easily over simple problems? No  Activities of Daily Living In your present state of health, do you have any difficulty performing the following activities?:  Driving? No Managing money?  No Feeding yourself? No Getting from bed to chair? No Climbing a flight of stairs? No Preparing food and eating?: No Bathing or showering? No Getting dressed: No Getting to the toilet? No Using the toilet:No Moving around from place to place: No In the past year have you fallen or had a near fall?:No   Are you sexually active?  Yes  Do you have more than one partner?  No  Hearing Difficulties: No Do you often ask people to speak up or repeat themselves? No Do you experience ringing or noises in your ears? No Do you have difficulty understanding soft or whispered voices? No   Do you feel that you have a problem with memory? No  Do you often misplace items? No  Do you feel safe at home?  yes  Cognitive Testing  Alert? Yes  Normal Appearance?Yes  Oriented to person? Yes  Place? Yes   Time? Yes  Recall of three objects?  Yes  Can perform simple calculations? Yes  Displays appropriate judgment?Yes  Can read the correct time from a watch face?Yes   Advanced Directives have been discussed with the patient? Yes   List the Names of Other Physician/Practitioners you currently use: 1.    Indicate any recent Medical Services you may have received from other than [facility] providers in the past year (date may be approximate).  Immunization History  Administered Date(s) Administered  . Influenza Split 05/04/2012  . Influenza,inj,Quad PF,36+ Mos 05/18/2013, 05/30/2014  . Influenza-Unspecified 06/10/2015  . Pneumococcal Conjugate-13 08/09/2014  .  Pneumococcal-Unspecified 07/27/2011    Screening Tests Health Maintenance  Topic Date Due  . TETANUS/TDAP  03/09/1958  . ZOSTAVAX  03/10/1999  . INFLUENZA VACCINE  02/24/2016  . PNA vac Low Risk Adult  Completed    All answers were reviewed with the patient and necessary referrals were made:  Marcial Pacas, DO   08/15/2015   History reviewed: allergies, current medications, past family history, past medical history, past social history, past surgical history and problem list  Review of Systems Review of Systems - General ROS: negative for - chills, fever, night sweats, weight gain or weight loss Ophthalmic ROS: negative for - decreased vision Psychological ROS: negative for - anxiety or depression ENT ROS: negative for - hearing change,tinnitus or allergies. Positive for nasal congestion Hematological and Lymphatic ROS: negative for - bleeding problems, bruising or swollen lymph nodes Breast ROS: negative Respiratory ROS: no cough, shortness of  breath, or wheezing Cardiovascular ROS: no chest pain or dyspnea on exertion Gastrointestinal ROS: no abdominal pain, change in bowel habits, or black or bloody stools Genito-Urinary ROS: negative for - genital discharge, genital ulcers, incontinence or abnormal bleeding from genitals Musculoskeletal ROS: negative for - joint pain or muscle pain Neurological ROS: negative for - headaches or memory loss Dermatological ROS: negative for lumps, mole changes, rash and skin lesion changes   Objective:     Vision by Snellen chart: right eye:20/25, left eye:20/30 Blood pressure 150/83, pulse 67, weight 214 lb (97.07 kg). Body mass index is 29.02 kg/(m^2).  General: No Acute Distress HEENT: Atraumatic, normocephalic, conjunctivae normal without scleral icterus.  No nasal discharge, hearing grossly intact, TMs with good landmarks bilaterally with no middle ear abnormalities, posterior pharynx clear without oral lesions. Neck: Supple, trachea  midline, no cervical nor supraclavicular adenopathy. Pulmonary: Clear to auscultation bilaterally without wheezing, rhonchi, nor rales. Cardiac: Regular rate and rhythm.  No murmurs, rubs, nor gallops. No peripheral edema.  2+ peripheral pulses bilaterally. Abdomen: Bowel sounds normal.  No masses.  Non-tender without rebound.  Negative Murphy's sign. MSK: Grossly intact, no signs of weakness.  Full strength throughout upper and lower extremities.  Full ROM in upper and lower extremities.  No midline spinal tenderness. Neuro: Gait unremarkable, CN II-XII grossly intact.  C5-C6 Reflex 2/4 Bilaterally, L4 Reflex 2/4 Bilaterally.  Cerebellar function intact. Skin: No rashes. Psych: Alert and oriented to person/place/time.  Thought process normal. No anxiety/depression.     Assessment:     Uncontrolled BPH and uncontrolled hypertension, increasing doxazosin. He would like to try Viagra for erectile dysfunction. I also let him know that he is probably a candidate for surgery help with his BPH however he's not interested in going under the knife at this time.  His nasal sinus congestion is bothering him bad enough to where I'm going to provide him with Depo-Medrol today.     Plan:     During the course of the visit the patient was educated and counseled about appropriate screening and preventive services including:    Td vaccine  Prostate cancer screening  Diet review for nutrition referral? not Indicated ____   Patient Instructions (the written plan) was given to the patient.  Medicare Attestation I have personally reviewed: The patient's medical and social history Their use of alcohol, tobacco or illicit drugs Their current medications and supplements The patient's functional ability including ADLs,fall risks, home safety risks, cognitive, and hearing and visual impairment Diet and physical activities Evidence for depression or mood disorders  The patient's weight, height, BMI,  and visual acuity have been recorded in the chart.  I have made referrals, counseling, and provided education to the patient based on review of the above and I have provided the patient with a written personalized care plan for preventive services.     Marcial Pacas, DO   08/15/2015

## 2015-08-15 NOTE — Addendum Note (Signed)
Addended by: Delrae Alfred on: 08/15/2015 01:38 PM   Modules accepted: Orders

## 2015-08-19 ENCOUNTER — Other Ambulatory Visit: Payer: Self-pay | Admitting: Family Medicine

## 2015-08-21 ENCOUNTER — Other Ambulatory Visit: Payer: Self-pay | Admitting: Family Medicine

## 2015-08-22 DIAGNOSIS — I10 Essential (primary) hypertension: Secondary | ICD-10-CM | POA: Diagnosis not present

## 2015-08-22 DIAGNOSIS — E785 Hyperlipidemia, unspecified: Secondary | ICD-10-CM | POA: Diagnosis not present

## 2015-08-22 DIAGNOSIS — R972 Elevated prostate specific antigen [PSA]: Secondary | ICD-10-CM | POA: Diagnosis not present

## 2015-08-22 DIAGNOSIS — E039 Hypothyroidism, unspecified: Secondary | ICD-10-CM | POA: Diagnosis not present

## 2015-08-22 LAB — CBC
HCT: 35.9 % — ABNORMAL LOW (ref 39.0–52.0)
Hemoglobin: 12.8 g/dL — ABNORMAL LOW (ref 13.0–17.0)
MCH: 31.5 pg (ref 26.0–34.0)
MCHC: 35.7 g/dL (ref 30.0–36.0)
MCV: 88.4 fL (ref 78.0–100.0)
MPV: 9.4 fL (ref 8.6–12.4)
Platelets: 270 10*3/uL (ref 150–400)
RBC: 4.06 MIL/uL — ABNORMAL LOW (ref 4.22–5.81)
RDW: 14.9 % (ref 11.5–15.5)
WBC: 4.4 10*3/uL (ref 4.0–10.5)

## 2015-08-23 LAB — COMPLETE METABOLIC PANEL WITH GFR
ALT: 15 U/L (ref 9–46)
AST: 18 U/L (ref 10–35)
Albumin: 3.7 g/dL (ref 3.6–5.1)
Alkaline Phosphatase: 66 U/L (ref 40–115)
BUN: 13 mg/dL (ref 7–25)
CO2: 30 mmol/L (ref 20–31)
CREATININE: 1.1 mg/dL (ref 0.70–1.18)
Calcium: 9 mg/dL (ref 8.6–10.3)
Chloride: 103 mmol/L (ref 98–110)
GFR, EST AFRICAN AMERICAN: 75 mL/min (ref >=60)
GFR, Est Non African American: 65 mL/min (ref >=60)
Glucose, Bld: 93 mg/dL (ref 65–99)
Potassium: 4.2 mmol/L (ref 3.5–5.3)
Sodium: 142 mmol/L (ref 135–146)
Total Bilirubin: 1 mg/dL (ref 0.2–1.2)
Total Protein: 7.1 g/dL (ref 6.1–8.1)

## 2015-08-23 LAB — PSA: PSA: 1.72 ng/mL (ref <=4.00)

## 2015-08-23 LAB — LIPID PANEL
Cholesterol: 129 mg/dL (ref 125–200)
HDL: 50 mg/dL (ref >=40)
LDL Cholesterol: 69 mg/dL (ref <130)
Total CHOL/HDL Ratio: 2.6 Ratio (ref <=5.0)
Triglycerides: 49 mg/dL (ref <150)
VLDL: 10 mg/dL (ref <30)

## 2015-08-23 LAB — TSH: TSH: 1.261 u[IU]/mL (ref 0.350–4.500)

## 2015-08-25 ENCOUNTER — Telehealth: Payer: Self-pay | Admitting: Family Medicine

## 2015-08-25 MED ORDER — FERROUS SULFATE 325 (65 FE) MG PO TBEC: 325.0000 mg | DELAYED_RELEASE_TABLET | Freq: Every day | ORAL | Status: AC

## 2015-08-25 NOTE — Telephone Encounter (Signed)
Will you please let patient know that his PSA prostate test, blood sugar, kidney function, liver function, and cholesterol were all normal.  His hemoglobin level was a few tenths below what's considered normal therefore I'd recommend he start on a daily 325mg  OTC Ferrous Sulfate tablet with breakfast every day. No need to adjust any of his other medications.

## 2015-08-25 NOTE — Telephone Encounter (Signed)
Detailed vm left with results pt advised to call back with any questions

## 2015-08-26 ENCOUNTER — Ambulatory Visit (INDEPENDENT_AMBULATORY_CARE_PROVIDER_SITE_OTHER): Payer: Medicare Other | Admitting: Family Medicine

## 2015-08-26 ENCOUNTER — Encounter: Payer: Self-pay | Admitting: Family Medicine

## 2015-08-26 VITALS — BP 146/84 | HR 67 | Wt 208.0 lb

## 2015-08-26 DIAGNOSIS — D649 Anemia, unspecified: Secondary | ICD-10-CM | POA: Insufficient documentation

## 2015-08-26 MED ORDER — SILDENAFIL CITRATE 20 MG PO TABS: ORAL_TABLET | ORAL | Status: AC

## 2015-08-26 NOTE — Progress Notes (Addendum)
CC: Jake Clarke is a 75 y.o. male is here for No chief complaint on file.   Subjective: HPI:  He had blood work done last week and he is kind of confused about the results. He got notified by 2 separate nurses but he still has questions about what his numbers remain. He tells me he is unaware of any personal history with anemia. He denies any bleeding or bruising abnormalities. Reports some fatigue but it's not on a daily basis and less than mild in severity.  Review Of Systems Outlined In HPI  Past Medical History  Diagnosis Date  . Essential hypertension, malignant   . Pure hypercholesterolemia   . Other abnormal blood chemistry   . Unspecified hypothyroidism   . Impotence of organic origin   . Urinary tract infection, site not specified   . Unspecified hemorrhoids without mention of complication   . Prostatic hypertrophy, benign, with obstruction     Past Surgical History  Procedure Laterality Date  . Bilateral upper extremity angiogram Right 04/05/2013    Procedure: UPPER EXTREMITY;  Surgeon: Conrad Utica, MD;  Location: [facility] CATH LAB;  Service: Cardiovascular;  Laterality: Right;  . Arch aortogram  04/05/2013    Procedure: ARCH AORTOGRAM;  Surgeon: Conrad Trumbull, MD;  Location: [facility] CATH LAB;  Service: Cardiovascular;;   Family History  Problem Relation Age of Onset  . Kidney disease Father   . Heart disease Sister   . Hypertension Mother   . Cancer Daughter     Social History   Social History  . Marital Status: Married    Spouse Name: N/A  . Number of Children: N/A  . Years of Education: N/A   Occupational History  . Not on file.   Social History Main Topics  . Smoking status: Never Smoker   . Smokeless tobacco: Former Systems developer  . Alcohol Use: No  . Drug Use: No  . Sexual Activity: Yes   Other Topics Concern  . Not on file   Social History Narrative     Objective: BP 146/84 mmHg  Pulse 67  Wt 208 lb (94.348 kg)  Vital signs reviewed. General: Alert and  Oriented, No Acute Distress HEENT: Pupils equal, round, reactive to light. Conjunctivae clear.  External ears unremarkable.  Moist mucous membranes. Lungs: Clear and comfortable work of breathing, speaking in full sentences without accessory muscle use. Cardiac: Regular rate and rhythm.  Neuro: CN II-XII grossly intact, gait normal. Extremities: No peripheral edema.  Strong peripheral pulses.  Mental Status: No depression, anxiety, nor agitation. Logical though process. Skin: Warm and dry. Assessment & Plan: Diagnoses and all orders for this visit:  Anemia, unspecified anemia type  Other orders -     sildenafil (REVATIO) 20 MG tablet; TAKE 1-5 TABLETS BY MOUTH ONCE DAILY AS NEEDED FOR SEXUAL ACTIVITY   Time was taken go over all of his labs, the only significant abnormality was a mild anemia. I answered all his questions about how iron and possibly B12 can help improve this.  Also, [facility] told him That they never received a prescription for generic Viagra.  15 minutes spent face-to-face during visit today of which at least 50% was counseling or coordinating care regarding: 1. Anemia, unspecified anemia type      Return in about 3 months (around 11/23/2015).

## 2015-09-07 ENCOUNTER — Other Ambulatory Visit: Payer: Self-pay | Admitting: Family Medicine

## 2015-10-09 ENCOUNTER — Other Ambulatory Visit: Payer: Self-pay | Admitting: Family Medicine

## 2015-12-06 ENCOUNTER — Other Ambulatory Visit: Payer: Self-pay | Admitting: Family Medicine

## 2016-01-02 ENCOUNTER — Other Ambulatory Visit: Payer: Self-pay | Admitting: Family Medicine

## 2016-01-12 ENCOUNTER — Other Ambulatory Visit: Payer: Self-pay | Admitting: Family Medicine

## 2016-01-27 DIAGNOSIS — I700 Atherosclerosis of aorta: Secondary | ICD-10-CM | POA: Diagnosis not present

## 2016-01-27 DIAGNOSIS — N200 Calculus of kidney: Secondary | ICD-10-CM | POA: Diagnosis not present

## 2016-01-27 DIAGNOSIS — I10 Essential (primary) hypertension: Secondary | ICD-10-CM | POA: Diagnosis not present

## 2016-01-27 DIAGNOSIS — N401 Enlarged prostate with lower urinary tract symptoms: Secondary | ICD-10-CM | POA: Diagnosis not present

## 2016-01-27 DIAGNOSIS — R42 Dizziness and giddiness: Secondary | ICD-10-CM | POA: Diagnosis not present

## 2016-01-27 DIAGNOSIS — J9851 Mediastinitis: Secondary | ICD-10-CM | POA: Diagnosis not present

## 2016-01-27 DIAGNOSIS — I723 Aneurysm of iliac artery: Secondary | ICD-10-CM | POA: Diagnosis not present

## 2016-01-27 DIAGNOSIS — E785 Hyperlipidemia, unspecified: Secondary | ICD-10-CM | POA: Diagnosis not present

## 2016-01-27 DIAGNOSIS — E039 Hypothyroidism, unspecified: Secondary | ICD-10-CM | POA: Diagnosis not present

## 2016-01-27 DIAGNOSIS — N2889 Other specified disorders of kidney and ureter: Secondary | ICD-10-CM | POA: Diagnosis not present

## 2016-01-28 ENCOUNTER — Encounter: Payer: Self-pay | Admitting: Family Medicine

## 2016-01-28 DIAGNOSIS — I700 Atherosclerosis of aorta: Secondary | ICD-10-CM | POA: Diagnosis not present

## 2016-01-28 DIAGNOSIS — E785 Hyperlipidemia, unspecified: Secondary | ICD-10-CM | POA: Diagnosis not present

## 2016-01-28 DIAGNOSIS — I7772 Dissection of iliac artery: Secondary | ICD-10-CM | POA: Diagnosis not present

## 2016-01-28 DIAGNOSIS — N2889 Other specified disorders of kidney and ureter: Secondary | ICD-10-CM | POA: Diagnosis not present

## 2016-01-28 DIAGNOSIS — I723 Aneurysm of iliac artery: Secondary | ICD-10-CM | POA: Diagnosis not present

## 2016-01-28 DIAGNOSIS — N200 Calculus of kidney: Secondary | ICD-10-CM | POA: Diagnosis not present

## 2016-01-28 DIAGNOSIS — I10 Essential (primary) hypertension: Secondary | ICD-10-CM | POA: Diagnosis not present

## 2016-01-28 DIAGNOSIS — E039 Hypothyroidism, unspecified: Secondary | ICD-10-CM | POA: Diagnosis not present

## 2016-01-28 DIAGNOSIS — N401 Enlarged prostate with lower urinary tract symptoms: Secondary | ICD-10-CM | POA: Diagnosis not present

## 2016-01-29 ENCOUNTER — Other Ambulatory Visit: Payer: Self-pay

## 2016-01-30 ENCOUNTER — Emergency Department
Admission: EM | Admit: 2016-01-30 | Discharge: 2016-01-30 | Disposition: A | Discharge status: Home / Self Care | Payer: Medicare Other | Source: Home / Self Care | Attending: Family Medicine | Admitting: Family Medicine

## 2016-01-30 ENCOUNTER — Ambulatory Visit: Payer: Medicare Other | Admitting: Family Medicine

## 2016-01-30 ENCOUNTER — Encounter: Payer: Self-pay | Admitting: *Deleted

## 2016-01-30 DIAGNOSIS — R270 Ataxia, unspecified: Secondary | ICD-10-CM

## 2016-01-30 DIAGNOSIS — N138 Other obstructive and reflux uropathy: Secondary | ICD-10-CM | POA: Diagnosis not present

## 2016-01-30 DIAGNOSIS — N401 Enlarged prostate with lower urinary tract symptoms: Secondary | ICD-10-CM | POA: Diagnosis not present

## 2016-01-30 DIAGNOSIS — I10 Essential (primary) hypertension: Secondary | ICD-10-CM | POA: Diagnosis not present

## 2016-01-30 NOTE — Discharge Instructions (Signed)
Recommend evaluation by neurologist.  Recommend using a cane.   Cane Use Canes are used to help with walking. Using a cane makes you more stable, reduces pain, and eases strain on certain muscle groups. It is important to use a cane that fits properly. A cane fits properly if the top of the cane comes to your wrist joint when you are standing upright with your arm relaxed at your side. HOW TO USE YOUR CANE Hold your cane in the hand opposite the injured or weaker side. Move the cane with the weaker foot at all times. Walking  Put as much weight on the cane as necessary to make walking comfortable, stable, and smooth.  Stand tall with good posture and look ahead, not down at your feet. Going Up Steps  Step first with your stronger foot.  Move the cane and the weaker foot up the step at the same time. Going Down Steps  Step first with the cane and your weaker foot.  Always use the railing with your free hand. SEEK MEDICAL CARE IF:  You still feel unsteady on your feet.  You develop new pain, for example in your back, shoulder, wrist, or hip.  You develop any numbness or tingling. SEEK IMMEDIATE MEDICAL CARE IF: You fall.   This information is not intended to replace advice given to you by your health care provider. Make sure you discuss any questions you have with your health care provider.   Document Released: 07/12/2005 Document Revised: 07/17/2013 Document Reviewed: 03/15/2013 Elsevier Interactive Patient Education 2016 [employer]   Ataxia Ataxia is a condition that results in unsteadiness when walking and standing, poor coordination of body movements, and difficulty maintaining an upright posture. It occurs due to a problem with the part of your brain that controls coordination and stability (cerebellar dysfunction).  CAUSES  Ataxia can develop later in life (acquired ataxia) during your 20s to 30s, and even as late as into your 60s or beyond. Acquired ataxia may be  caused by:  Changes in your nervous system (neurodegenerative).  Changes throughout your body (systemic disorders).  Excess exposure to:  Medicines, such as phenytoin and lithium.  Solvents.  Abuse of alcohol (alcoholism).  Medical conditions, such as:  Celiac sprue.  Hypothyroidism.  Vitamin E deficiency.  Structural brain abnormalities, such as tumors.  Multiple sclerosis.  Stroke.  Head injury. Ataxia may also be present early in life (non-acquired ataxia). There are two main types of non-acquired ataxia:  Cerebellar dysfunction present at birth (congenital).  Family inheritance (genetic heredity). Friedreich ataxia is the most common form of hereditary ataxia. SIGNS AND SYMPTOMS The signs and symptoms of ataxia can vary depending on how severe the condition is that causes it. Signs and symptoms may include:  Unsteadiness.  Walking with a wide stance.  Tremor.  Poorly coordinated body movements.  Difficulty maintaining a straight (upright) posture.  Fatigue.  Changes in your speech.  Changes in your vision.  Difficulty swallowing.  Difficulty with writing.  Decreased mental status (dementia).  Muscle spasms. DIAGNOSIS  Ataxia is diagnosed by discussing your personal and family history and through a physical exam. You may also have additional tests such as:  MRI.  Genetic testing. TREATMENT  Treatment for ataxia may include treating or removing the underlying condition causing the ataxia. Surgery may be required if a structural abnormality in your brain is causing the ataxia. Otherwise, supportive treatments may be used to manage your symptoms. HOME CARE INSTRUCTIONS Monitor your ataxia for  any changes. The following actions may help any discomfort you are experiencing:   Do not drink alcohol.  Lie down right away if you become very unsteady, dizzy, nauseated, or feel like you are going to faint. Wait until all of these feelings pass before  you get up again. SEEK IMMEDIATE MEDICAL CARE IF:  Your unsteadiness suddenly worsens.  You develop severe headaches, chest pain, or abdominal pain.   You have weakness or numbness on one side of your body.   You have problems with your vision.   You feel confused.   You have difficulty speaking.   You have an irregular heartbeat or a very fast pulse.    This information is not intended to replace advice given to you by your health care provider. Make sure you discuss any questions you have with your health care provider.   Document Released: 02/06/2014 Document Reviewed: 02/06/2014 Elsevier Interactive Patient Education [employer].

## 2016-01-30 NOTE — ED Notes (Addendum)
Pt is accompanied by his wife and daughter. Reports he went to ER @ [facility] on 01/27/16 for dizziness and altered gait. He had been off of his BP medication for 1 week b/c he misunderstood and thought he was supposed to be off. 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 He was kept until the next day and placed on 2 new antihypertensives, cozaar and Norvasc. Since, he c/o left sided weakness and still experiencing gait abnormalities. His PCP is Dr. Ileene Rubens who was unable to see him today. On assessment, equal strength bilaterally.

## 2016-01-30 NOTE — ED Provider Notes (Signed)
CSN: [id number]     Arrival date & time 01/30/16  1511 History   First MD Initiated Contact with Patient 01/30/16 1542     Chief Complaint  Patient presents with  . Gait Problem  . Extremity Weakness      HPI Comments: Patient presents with his wife, daughter, and son-in- law.   He complains of dizziness, feeling off-balance, and weakness in his left leg. Four days ago patient felt dizzy and off-balance and was noted to be staggering by his family.  He presented to the [facility] ER where he was admitted for evaluation.  He was found to have accelerated hypertension, and BP control was achieved with addition of Norvasc and Cozaar. CT Head w/o contrast showed chronic microvascular disease and brain atrophy but no acute changes.  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  A chest X-ray showed irregular configuration of the thoracic aortic arch, and CT angio was recommended to exclude aortic aneurysm.  Lungs were clear.  Patient requested discharge after 24 hours. His present symptoms of dizziness, feeling off-balance, and weakness in his left leg have not changed since his discharge from the [facility].  He feels better than prior to admission to hospital, and reports no new symptoms.  The history is provided by the patient, the spouse and a relative.    Past Medical History  Diagnosis Date  . Essential hypertension, malignant   . Pure hypercholesterolemia   . Other abnormal blood chemistry   . Unspecified hypothyroidism   . Impotence of organic origin   . Urinary tract infection, site not specified   . Unspecified hemorrhoids without mention of complication   . Prostatic hypertrophy, benign, with obstruction   . Right renal mass     Past Surgical History  Procedure Laterality Date  . Bilateral upper extremity angiogram Right 04/05/2013    Procedure: UPPER EXTREMITY;  Surgeon: Conrad Deerfield, MD;  Location: [facility] CATH LAB;  Service: Cardiovascular;  Laterality: Right;  . Arch aortogram  04/05/2013    Procedure: ARCH AORTOGRAM;  Surgeon: Conrad Murray, MD;  Location: [facility] CATH LAB;  Service: Cardiovascular;;   Family History  Problem Relation Age of Onset  . Kidney disease Father   . Heart disease Sister   . Hypertension Mother   . Cancer Daughter    Social History  Substance Use Topics  . Smoking status: Never Smoker   . Smokeless tobacco: Former Systems developer  . Alcohol Use: No    Review of Systems  Constitutional: Positive for activity change, appetite change and fatigue. Negative for fever, chills, diaphoresis and unexpected weight change.  HENT: Negative for congestion, ear pain, facial swelling, hearing loss, nosebleeds, sinus pressure, sore throat, trouble swallowing and voice change.   Eyes: Negative for photophobia, pain, redness and visual disturbance.  Respiratory: Negative for cough, chest tightness, shortness of breath and wheezing.   Cardiovascular: Positive for leg swelling. Negative for chest pain and palpitations.  Gastrointestinal: Negative for nausea, vomiting, abdominal pain and constipation.  Endocrine: Negative for polydipsia, polyphagia and polyuria.  Genitourinary: Positive for decreased urine volume and difficulty urinating. Negative for dysuria, urgency, frequency, hematuria, flank pain and testicular pain.  Musculoskeletal:       Left leg swelling  Skin: Negative.  Neurological: Positive for dizziness, weakness and light-headedness. Negative for tremors, seizures, syncope, facial asymmetry, speech difficulty, numbness and headaches.  Psychiatric/Behavioral: Negative for behavioral problems and confusion.    Allergies  Review of patient's allergies indicates no known allergies.  Home Medications    Prior to Admission medications   Medication Sig Start Date End Date Taking? Authorizing Provider  amLODipine (NORVASC) 5 MG tablet Take 5 mg by mouth. 01/28/16 02/27/16  Historical Provider, MD  atorvastatin (LIPITOR) 20 MG tablet TAKE 1 TABLET (20 MG TOTAL) BY MOUTH DAILY. 08/13/15   Marcial Pacas, DO  Calcium Carbonate (CALTRATE 600 PO) Take 600 mg by mouth daily.     Historical Provider, MD  diclofenac (VOLTAREN) 50 MG EC tablet TAKE ONE TABLET EVERY 8 HOURS ONLY AS NEEDED FOR PAIN, TAKE WITH SMALL SNACK. 11/25/14   Sean Hommel, DO  doxazosin (CARDURA) 8 MG tablet Take 1 tablet (8 mg total) by mouth daily. 08/15/15   Marcial Pacas, DO  ferrous sulfate 325 (65 FE) MG EC tablet Take 1 tablet (325 mg total) by mouth daily with breakfast. 08/25/15   Sean Hommel, DO  finasteride (PROSCAR) 5 MG tablet TAKE 1 TABLET (5 MG TOTAL) BY MOUTH DAILY. 10/09/15   Marcial Pacas, DO  levothyroxine (SYNTHROID, LEVOTHROID) 75 MCG tablet TAKE 1 TABLET BY MOUTH EVERY DAY 08/19/15   Sean Hommel, DO  losartan (COZAAR) 50 MG tablet Take 50 mg by mouth. 01/28/16 02/27/16  Historical Provider, MD  Multiple Vitamins-Minerals (CENTRUM SILVER PO) Take 1 tablet by mouth daily.     Historical Provider, MD  sildenafil (REVATIO) 20 MG tablet TAKE 1-5 TABLETS BY MOUTH ONCE DAILY AS NEEDED FOR SEXUAL ACTIVITY 08/26/15   Marcial Pacas, DO  tamsulosin (FLOMAX) 0.4 MG CAPS capsule TAKE 2 CAPSULES (0.8 MG TOTAL) BY MOUTH DAILY. NEED FOLLOW UP APPOINTMENT FOR MORE REFILLS 01/12/16   Marcial Pacas, DO   Meds Ordered and Administered this Visit  Medications - No data to display  BP 132/79 mmHg  Pulse 68  Temp(Src) 98.1 F (36.7 C) (Oral)  Resp 14  Wt 203 lb (92.08 kg)  SpO2 97% No data found.   Physical Exam  Constitutional: He is oriented to person, place, and time. He appears well-developed and well-nourished. No distress.  HENT:  Head: Normocephalic.  Right Ear: Tympanic membrane, external ear and ear canal normal.  Left Ear: Tympanic  membrane, external ear and ear canal normal.  Nose: Nose normal.  Mouth/Throat: Oropharynx is clear and moist.  Eyes: Conjunctivae and EOM are normal. Pupils are equal, round, and reactive to light.  Neck: Neck supple. No JVD present.  Cardiovascular: Normal heart sounds.   Pulmonary/Chest: Breath sounds normal.  Abdominal: Soft. Bowel sounds are normal. There is no tenderness.  Musculoskeletal: He exhibits edema. He exhibits no tenderness.  Lower extremities have trace edema on the right, and 1+ edema on the left.  No calf tenderness.  Pedal pulses intact.  Lymphadenopathy:    He has no cervical adenopathy.  Neurological: He is alert and oriented to person, place, and time. He has normal strength and normal reflexes. No cranial nerve deficit or sensory deficit. Coordination abnormal.  Patient noted to have truncal ataxia; he tends to drift backward when sitting and has difficulty remaining upright. Romberg test is positive:  Patient tends to fall backwards with his eyes closed. He has difficulty with rapid alternating movements (pronation and supination of hands together) He has difficulty performing finger-to-nose movements and exhibits "past pointing."  Skin: Skin  is warm and dry. No rash noted. No erythema.  Psychiatric: He has a normal mood and affect.  Nursing note and vitals reviewed.   ED Course  Procedures none  Imaging Review See history of present illness for review of scans performed at [place]   1. 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  Essential hypertension   Note improved/stable BP today. Patient's symptoms and exam are suggestive of damage to midline cerebellar structures. With his recent CTA findings of dissection within the left external iliac artery, and chest x-ray findings or irregular configuration of the thoracic aortic arch, recommend CTA brain. Patient's increased left leg edema probably a result of  venous compression by the aneurysmal dilatation in his left external iliac artery. Recommend follow-up with PCP in 3 days. Recommend evaluation by neurologist.  Recommend using a cane. If symptoms become significantly worse during the night or over the weekend, proceed to the local emergency room.     Kandra Nicolas, MD 02/01/16 [phone]

## 2016-02-02 ENCOUNTER — Ambulatory Visit (INDEPENDENT_AMBULATORY_CARE_PROVIDER_SITE_OTHER): Payer: Medicare Other | Admitting: Family Medicine

## 2016-02-02 ENCOUNTER — Encounter: Payer: Self-pay | Admitting: Family Medicine

## 2016-02-02 ENCOUNTER — Telehealth: Payer: Self-pay | Admitting: Family Medicine

## 2016-02-02 VITALS — BP 151/79 | HR 73 | Wt 205.0 lb

## 2016-02-02 DIAGNOSIS — N2889 Other specified disorders of kidney and ureter: Secondary | ICD-10-CM | POA: Diagnosis not present

## 2016-02-02 DIAGNOSIS — I10 Essential (primary) hypertension: Secondary | ICD-10-CM | POA: Diagnosis not present

## 2016-02-02 DIAGNOSIS — S35512A Injury of left iliac artery, initial encounter: Secondary | ICD-10-CM

## 2016-02-02 DIAGNOSIS — R29898 Other symptoms and signs involving the musculoskeletal system: Secondary | ICD-10-CM

## 2016-02-02 DIAGNOSIS — R42 Dizziness and giddiness: Secondary | ICD-10-CM

## 2016-02-02 MED ORDER — AMLODIPINE BESYLATE 10 MG PO TABS: ORAL_TABLET | ORAL | Status: AC

## 2016-02-02 MED ORDER — TAMSULOSIN HCL 0.4 MG PO CAPS: ORAL_CAPSULE | ORAL | Status: AC

## 2016-02-02 NOTE — Telephone Encounter (Signed)
Will you please let patient's daughter know Jake Clarke [id number] [phone]) that I spoke to a neurologist who recommended Jake Clarke get an MRI instead of a CT scan. The MRI will provide much more info that a CT scan might miss. Order will be placed

## 2016-02-02 NOTE — Progress Notes (Signed)
CC: Jake Clarke is a 75 y.o. male is here for Hospitalization Follow-up   Subjective: HPI:  About a month ago patient ran out of Flomax and then a few weeks ago ran out of doxazosin. He was under the impression that he no longer needed to be on these medications and that I intentionally stopped them. He ran out of refills prior to following up for refills. It was not my intention for him to stop either of these medications. On Monday of last week he noticed he was feeling somewhat dizzy and fatigued. He reports stage II hypertension and his family took him to a local hospital where he was admitted. Better blood pressure control. He had a CT of the head without contrast which was unremarkable, CTA of the chest was unremarkable. 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 This last finding was addressed by addressed by vascular in the hospital who felt he could be handled in the outpatient setting.  He said he was starting to feel better on Wednesday when he returned home health on Thursday he began to have left leg weakness and dizziness with poor balance. He was told he needed to get a CTA of the brain and over the week and has been recovering. He tells me his left leg weakness is now almost completely gone. He denies any other motor or sensory disturbances right now and denies any dizziness. He denies any vision disturbance or confusion. There's been no recent or remote memory loss slurred speech or difficulty swallowing.   Review Of Systems Outlined In HPI  Past Medical History  Diagnosis Date  . Essential hypertension, malignant   . Pure hypercholesterolemia   . Other abnormal blood chemistry   . Unspecified hypothyroidism   . Impotence of organic origin   . Urinary tract infection, site not specified   . Unspecified hemorrhoids without mention of complication   . Prostatic hypertrophy, benign, with obstruction   . Right renal  mass     Past Surgical History  Procedure Laterality Date  . Bilateral upper extremity angiogram Right 04/05/2013    Procedure: UPPER EXTREMITY;  Surgeon: Conrad Daniel, MD;  Location: [facility] CATH LAB;  Service: Cardiovascular;  Laterality: Right;  . Arch aortogram  04/05/2013    Procedure: ARCH AORTOGRAM;  Surgeon: Conrad Red Lake, MD;  Location: [facility] CATH LAB;  Service: Cardiovascular;;   Family History  Problem Relation Age of Onset  . Kidney disease Father   . Heart disease Sister   . Hypertension Mother   . Cancer Daughter     Social History   Social History  . Marital Status: Married    Spouse Name: N/A  . Number of Children: N/A  . Years of Education: N/A   Occupational History  . Not on file.   Social History Main Topics  . Smoking status: Never Smoker   . Smokeless tobacco: Former Systems developer  . Alcohol Use: No  . Drug Use: No  . Sexual Activity: Yes   Other Topics Concern  . Not on file   Social History Narrative     Objective: BP 151/79 mmHg  Pulse 73  Wt 205 lb (92.987 kg)  General: Alert and Oriented, No Acute Distress HEENT: Pupils equal, round, reactive to light. Conjunctivae clear.  Moist mucous membranes Neuro: CN II-XII grossly intact, full strength/rom of all four extremities, C5/L4/S1 DTRs 2/4 bilaterally, gait normal, rapid alternating movements normal, heel-shin test  normal, Rhomberg normal. Lungs: Clear to auscultation bilaterally, no wheezing/ronchi/rales.  Comfortable work of breathing. Good air movement. Cardiac: Regular rate and rhythm. Normal S1/S2.  No murmurs, rubs, nor gallops.  No carotid bruits Extremities: No peripheral edema.  Strong peripheral pulses.  Mental Status: No depression, anxiety, nor agitation. Skin: Warm and dry.  Assessment & Plan: Jake Clarke was seen today for hospitalization follow-up.  Diagnoses and all orders for this visit:  Essential hypertension  Left renal mass  Left external iliac artery transection, initial  encounter  Left leg weakness -     MR Brain Wo Contrast  Dizziness and giddiness -     MR Brain Wo Contrast  Other orders -     tamsulosin (FLOMAX) 0.4 MG CAPS capsule; Take 1-2 capsules at bedtime daily as needed for enlarged prostate symptoms and urinary frequency. -     amLODipine (NORVASC) 10 MG tablet; One tablet by mouth every day for blood pressure control.   Patient's daughter would like to be in charge of scheduling and the patient is okay with this. Mardene Celeste [id number] [phone] Essential hypertension: Uncontrolled chronic condition increasing amlodipine continue current dose of losartan. I never intended him to stop taking Flomax and have encouraged him to restart this, he admits that ever since stopping his medication he's been having to urinate much more frequent recently. He scheduled to have a biopsy of his left renal mass on Friday. Due to time to answer the family's questions about him possibly having a stroke and it would be best to get an MRI of the brain sometime in the near future.Signs and symptoms requring emergent/urgent reevaluation were discussed with the patient.  40 minutes spent face-to-face during visit today of which at least 50% was counseling or coordinating care regarding: 1. Essential hypertension   2. Left renal mass   3. Left external iliac artery transection, initial encounter   4. Left leg weakness   5. Dizziness and giddiness       Return in about 1 week (around 02/09/2016) for BP Recheck.

## 2016-02-03 NOTE — Telephone Encounter (Signed)
Daughter notified and transferred to imaging

## 2016-02-05 DIAGNOSIS — E079 Disorder of thyroid, unspecified: Secondary | ICD-10-CM | POA: Diagnosis not present

## 2016-02-05 DIAGNOSIS — I10 Essential (primary) hypertension: Secondary | ICD-10-CM | POA: Diagnosis not present

## 2016-02-05 DIAGNOSIS — N2889 Other specified disorders of kidney and ureter: Secondary | ICD-10-CM | POA: Diagnosis not present

## 2016-02-05 DIAGNOSIS — Z79899 Other long term (current) drug therapy: Secondary | ICD-10-CM | POA: Diagnosis not present

## 2016-02-05 DIAGNOSIS — E785 Hyperlipidemia, unspecified: Secondary | ICD-10-CM | POA: Diagnosis not present

## 2016-02-06 DIAGNOSIS — C642 Malignant neoplasm of left kidney, except renal pelvis: Secondary | ICD-10-CM | POA: Diagnosis not present

## 2016-02-09 ENCOUNTER — Encounter: Payer: Self-pay | Admitting: Family Medicine

## 2016-02-09 ENCOUNTER — Inpatient Hospital Stay: Payer: Medicare Other | Admitting: Family Medicine

## 2016-02-09 ENCOUNTER — Ambulatory Visit: Payer: Medicare Other | Admitting: Family Medicine

## 2016-02-09 ENCOUNTER — Other Ambulatory Visit: Payer: Medicare Other

## 2016-02-09 DIAGNOSIS — E039 Hypothyroidism, unspecified: Secondary | ICD-10-CM | POA: Diagnosis not present

## 2016-02-09 DIAGNOSIS — M1990 Unspecified osteoarthritis, unspecified site: Secondary | ICD-10-CM | POA: Diagnosis not present

## 2016-02-09 DIAGNOSIS — I723 Aneurysm of iliac artery: Secondary | ICD-10-CM | POA: Diagnosis not present

## 2016-02-09 DIAGNOSIS — E785 Hyperlipidemia, unspecified: Secondary | ICD-10-CM | POA: Diagnosis not present

## 2016-02-09 DIAGNOSIS — C642 Malignant neoplasm of left kidney, except renal pelvis: Secondary | ICD-10-CM | POA: Diagnosis not present

## 2016-02-09 DIAGNOSIS — K567 Ileus, unspecified: Secondary | ICD-10-CM | POA: Diagnosis not present

## 2016-02-09 DIAGNOSIS — I10 Essential (primary) hypertension: Secondary | ICD-10-CM | POA: Diagnosis not present

## 2016-02-09 DIAGNOSIS — N2889 Other specified disorders of kidney and ureter: Secondary | ICD-10-CM | POA: Diagnosis not present

## 2016-02-09 DIAGNOSIS — I700 Atherosclerosis of aorta: Secondary | ICD-10-CM | POA: Diagnosis not present

## 2016-02-09 DIAGNOSIS — N401 Enlarged prostate with lower urinary tract symptoms: Secondary | ICD-10-CM | POA: Diagnosis not present

## 2016-02-09 DIAGNOSIS — C649 Malignant neoplasm of unspecified kidney, except renal pelvis: Secondary | ICD-10-CM | POA: Insufficient documentation

## 2016-02-09 DIAGNOSIS — N138 Other obstructive and reflux uropathy: Secondary | ICD-10-CM | POA: Diagnosis not present

## 2016-02-09 DIAGNOSIS — K9189 Other postprocedural complications and disorders of digestive system: Secondary | ICD-10-CM | POA: Diagnosis not present

## 2016-02-09 DIAGNOSIS — N200 Calculus of kidney: Secondary | ICD-10-CM | POA: Diagnosis not present

## 2016-02-09 DIAGNOSIS — D49512 Neoplasm of unspecified behavior of left kidney: Secondary | ICD-10-CM | POA: Diagnosis not present

## 2016-02-10 ENCOUNTER — Encounter: Payer: Self-pay | Admitting: Family Medicine

## 2016-02-24 DIAGNOSIS — N400 Enlarged prostate without lower urinary tract symptoms: Secondary | ICD-10-CM | POA: Diagnosis not present

## 2016-02-24 DIAGNOSIS — C649 Malignant neoplasm of unspecified kidney, except renal pelvis: Secondary | ICD-10-CM | POA: Diagnosis not present

## 2016-02-26 DIAGNOSIS — C642 Malignant neoplasm of left kidney, except renal pelvis: Secondary | ICD-10-CM | POA: Diagnosis not present

## 2016-02-26 DIAGNOSIS — Z7689 Persons encountering health services in other specified circumstances: Secondary | ICD-10-CM | POA: Diagnosis not present

## 2016-02-26 DIAGNOSIS — I10 Essential (primary) hypertension: Secondary | ICD-10-CM | POA: Diagnosis not present

## 2016-02-26 DIAGNOSIS — N182 Chronic kidney disease, stage 2 (mild): Secondary | ICD-10-CM | POA: Diagnosis not present

## 2016-02-26 DIAGNOSIS — I723 Aneurysm of iliac artery: Secondary | ICD-10-CM | POA: Diagnosis not present

## 2016-02-29 ENCOUNTER — Other Ambulatory Visit: Payer: Self-pay | Admitting: Family Medicine

## 2016-03-01 ENCOUNTER — Ambulatory Visit (INDEPENDENT_AMBULATORY_CARE_PROVIDER_SITE_OTHER): Payer: Medicare Other

## 2016-03-01 DIAGNOSIS — R42 Dizziness and giddiness: Secondary | ICD-10-CM | POA: Diagnosis not present

## 2016-03-01 DIAGNOSIS — G939 Disorder of brain, unspecified: Secondary | ICD-10-CM

## 2016-03-02 ENCOUNTER — Telehealth: Payer: Self-pay

## 2016-03-02 ENCOUNTER — Telehealth: Payer: Self-pay | Admitting: Family Medicine

## 2016-03-02 DIAGNOSIS — R930 Abnormal findings on diagnostic imaging of skull and head, not elsewhere classified: Secondary | ICD-10-CM

## 2016-03-02 DIAGNOSIS — G9389 Other specified disorders of brain: Secondary | ICD-10-CM

## 2016-03-02 DIAGNOSIS — R9089 Other abnormal findings on diagnostic imaging of central nervous system: Secondary | ICD-10-CM

## 2016-03-02 NOTE — Telephone Encounter (Signed)
Left recommendations on vm pt advised to call with any questions

## 2016-03-02 NOTE — Telephone Encounter (Signed)
Will you please let patient know that the radiologist has requested an additional MRI, this time with contrast.  I've placed a new referral for this to be done in [place].  There were no signs of having a stroke which is reassuring.

## 2016-03-02 NOTE — Telephone Encounter (Signed)
Lab order placed.

## 2016-03-06 ENCOUNTER — Encounter (HOSPITAL_BASED_OUTPATIENT_CLINIC_OR_DEPARTMENT_OTHER): Payer: Self-pay

## 2016-03-06 ENCOUNTER — Ambulatory Visit (HOSPITAL_BASED_OUTPATIENT_CLINIC_OR_DEPARTMENT_OTHER): Admission: RE | Admit: 2016-03-06 | Payer: Medicare Other | Source: Ambulatory Visit

## 2016-03-06 ENCOUNTER — Ambulatory Visit (HOSPITAL_BASED_OUTPATIENT_CLINIC_OR_DEPARTMENT_OTHER)
Admission: RE | Admit: 2016-03-06 | Discharge: 2016-03-06 | Disposition: A | Discharge status: Home / Self Care | Payer: Medicare Other | Source: Ambulatory Visit | Attending: Family Medicine | Admitting: Family Medicine

## 2016-03-06 DIAGNOSIS — R930 Abnormal findings on diagnostic imaging of skull and head, not elsewhere classified: Secondary | ICD-10-CM | POA: Diagnosis not present

## 2016-03-08 ENCOUNTER — Telehealth: Payer: Self-pay | Admitting: Family Medicine

## 2016-03-08 NOTE — Telephone Encounter (Signed)
Can you please contact radiology and see if there is anything holding up this patient's MRI Brain with contrast?  I would still like this to be done.

## 2016-03-08 NOTE — Telephone Encounter (Signed)
Left vm for pt to return call so that we can touch bases

## 2016-03-09 NOTE — Telephone Encounter (Signed)
Can you please contact radiology and see if there is anything holding up this patient's MRI Brain with contrast?  I would still like this to be done.

## 2016-03-09 NOTE — Telephone Encounter (Signed)
Pt established care with [facility] on 02/26/16 with Dr. Hoy Morn.

## 2016-03-11 ENCOUNTER — Telehealth: Payer: Self-pay

## 2016-03-11 NOTE — Telephone Encounter (Signed)
Left results and recommendations on daughters vm

## 2016-03-11 NOTE — Telephone Encounter (Signed)
Yes, there was a concern that the original MRI could have showed a blood clot in one of the brain's veins but the MRI from August 12th ruled this out.  He does not have a blood clot in his brain and the radiologist has recommend he have this repeated in 6 months.

## 2016-04-07 DIAGNOSIS — R350 Frequency of micturition: Secondary | ICD-10-CM | POA: Diagnosis not present

## 2016-04-07 DIAGNOSIS — N400 Enlarged prostate without lower urinary tract symptoms: Secondary | ICD-10-CM | POA: Diagnosis not present

## 2016-04-07 DIAGNOSIS — Z85528 Personal history of other malignant neoplasm of kidney: Secondary | ICD-10-CM | POA: Diagnosis not present

## 2016-04-07 DIAGNOSIS — C642 Malignant neoplasm of left kidney, except renal pelvis: Secondary | ICD-10-CM | POA: Diagnosis not present

## 2016-04-07 DIAGNOSIS — N182 Chronic kidney disease, stage 2 (mild): Secondary | ICD-10-CM | POA: Diagnosis not present

## 2016-04-08 DIAGNOSIS — G939 Disorder of brain, unspecified: Secondary | ICD-10-CM | POA: Diagnosis not present

## 2016-04-08 DIAGNOSIS — R930 Abnormal findings on diagnostic imaging of skull and head, not elsewhere classified: Secondary | ICD-10-CM | POA: Diagnosis not present

## 2016-04-08 DIAGNOSIS — I679 Cerebrovascular disease, unspecified: Secondary | ICD-10-CM | POA: Diagnosis not present

## 2016-04-16 DIAGNOSIS — R29898 Other symptoms and signs involving the musculoskeletal system: Secondary | ICD-10-CM | POA: Diagnosis not present

## 2016-04-16 DIAGNOSIS — R2689 Other abnormalities of gait and mobility: Secondary | ICD-10-CM | POA: Diagnosis not present

## 2016-04-16 DIAGNOSIS — R2681 Unsteadiness on feet: Secondary | ICD-10-CM | POA: Diagnosis not present

## 2016-04-21 DIAGNOSIS — Z85528 Personal history of other malignant neoplasm of kidney: Secondary | ICD-10-CM | POA: Diagnosis not present

## 2016-04-21 DIAGNOSIS — N400 Enlarged prostate without lower urinary tract symptoms: Secondary | ICD-10-CM | POA: Diagnosis not present

## 2016-04-21 DIAGNOSIS — N200 Calculus of kidney: Secondary | ICD-10-CM | POA: Diagnosis not present

## 2016-04-28 DIAGNOSIS — R2681 Unsteadiness on feet: Secondary | ICD-10-CM | POA: Diagnosis not present

## 2016-05-06 DIAGNOSIS — R2681 Unsteadiness on feet: Secondary | ICD-10-CM | POA: Diagnosis not present

## 2016-05-11 DIAGNOSIS — N401 Enlarged prostate with lower urinary tract symptoms: Secondary | ICD-10-CM | POA: Diagnosis not present

## 2016-05-13 DIAGNOSIS — R2681 Unsteadiness on feet: Secondary | ICD-10-CM | POA: Diagnosis not present

## 2016-05-14 DIAGNOSIS — R930 Abnormal findings on diagnostic imaging of skull and head, not elsewhere classified: Secondary | ICD-10-CM | POA: Diagnosis not present

## 2016-05-14 DIAGNOSIS — G939 Disorder of brain, unspecified: Secondary | ICD-10-CM | POA: Diagnosis not present

## 2016-05-14 DIAGNOSIS — I679 Cerebrovascular disease, unspecified: Secondary | ICD-10-CM | POA: Diagnosis not present

## 2016-05-17 DIAGNOSIS — R2681 Unsteadiness on feet: Secondary | ICD-10-CM | POA: Diagnosis not present

## 2016-05-18 DIAGNOSIS — R3915 Urgency of urination: Secondary | ICD-10-CM | POA: Diagnosis not present

## 2016-05-18 DIAGNOSIS — N189 Chronic kidney disease, unspecified: Secondary | ICD-10-CM | POA: Diagnosis not present

## 2016-05-18 DIAGNOSIS — N9961 Intraoperative hemorrhage and hematoma of a genitourinary system organ or structure complicating a genitourinary system procedure: Secondary | ICD-10-CM | POA: Diagnosis not present

## 2016-05-18 DIAGNOSIS — R3914 Feeling of incomplete bladder emptying: Secondary | ICD-10-CM | POA: Diagnosis not present

## 2016-05-18 DIAGNOSIS — Z85528 Personal history of other malignant neoplasm of kidney: Secondary | ICD-10-CM | POA: Diagnosis not present

## 2016-05-18 DIAGNOSIS — E039 Hypothyroidism, unspecified: Secondary | ICD-10-CM | POA: Diagnosis not present

## 2016-05-18 DIAGNOSIS — N138 Other obstructive and reflux uropathy: Secondary | ICD-10-CM | POA: Diagnosis not present

## 2016-05-18 DIAGNOSIS — D62 Acute posthemorrhagic anemia: Secondary | ICD-10-CM | POA: Diagnosis not present

## 2016-05-18 DIAGNOSIS — N401 Enlarged prostate with lower urinary tract symptoms: Secondary | ICD-10-CM | POA: Diagnosis not present

## 2016-05-18 DIAGNOSIS — I129 Hypertensive chronic kidney disease with stage 1 through stage 4 chronic kidney disease, or unspecified chronic kidney disease: Secondary | ICD-10-CM | POA: Diagnosis not present

## 2016-05-18 DIAGNOSIS — Z905 Acquired absence of kidney: Secondary | ICD-10-CM | POA: Diagnosis not present

## 2016-05-18 DIAGNOSIS — E785 Hyperlipidemia, unspecified: Secondary | ICD-10-CM | POA: Diagnosis not present

## 2016-05-18 DIAGNOSIS — N400 Enlarged prostate without lower urinary tract symptoms: Secondary | ICD-10-CM | POA: Diagnosis not present

## 2016-05-18 DIAGNOSIS — N200 Calculus of kidney: Secondary | ICD-10-CM | POA: Diagnosis not present

## 2016-05-18 DIAGNOSIS — R350 Frequency of micturition: Secondary | ICD-10-CM | POA: Diagnosis not present

## 2016-05-18 DIAGNOSIS — Z5309 Procedure and treatment not carried out because of other contraindication: Secondary | ICD-10-CM | POA: Diagnosis not present

## 2016-05-28 DIAGNOSIS — E785 Hyperlipidemia, unspecified: Secondary | ICD-10-CM | POA: Diagnosis not present

## 2016-05-28 DIAGNOSIS — E039 Hypothyroidism, unspecified: Secondary | ICD-10-CM | POA: Diagnosis not present

## 2016-05-28 DIAGNOSIS — I10 Essential (primary) hypertension: Secondary | ICD-10-CM | POA: Diagnosis not present

## 2016-05-28 DIAGNOSIS — N182 Chronic kidney disease, stage 2 (mild): Secondary | ICD-10-CM | POA: Diagnosis not present

## 2016-05-28 DIAGNOSIS — Z23 Encounter for immunization: Secondary | ICD-10-CM | POA: Diagnosis not present

## 2016-05-28 DIAGNOSIS — D649 Anemia, unspecified: Secondary | ICD-10-CM | POA: Diagnosis not present

## 2016-06-01 DIAGNOSIS — Z85528 Personal history of other malignant neoplasm of kidney: Secondary | ICD-10-CM | POA: Diagnosis not present

## 2016-06-07 DIAGNOSIS — R2681 Unsteadiness on feet: Secondary | ICD-10-CM | POA: Diagnosis not present

## 2016-06-14 DIAGNOSIS — R2681 Unsteadiness on feet: Secondary | ICD-10-CM | POA: Diagnosis not present

## 2018-02-28 IMAGING — MR MR [PERSON_NAME] HEAD
1 series · 48 of 48 positions shown · non-contrast
Comparison: MRI head 03/01/2016

CLINICAL DATA: Abnormal MRI head.  GFR 28

EXAM:
MR VENOGRAM the HEAD WITHOUT CONTRAST
TECHNIQUE: Angiographic images of the intracranial venous structures were
obtained using MRV technique without intravenous contrast.

[Series 2: tof_2d_veins · coronal · 3.0mm · 0.98mm/px · 48 of 105 slices shown]
[im 1/105]
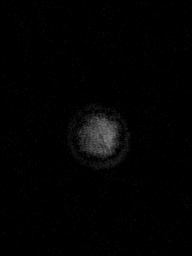
[im 3/105]
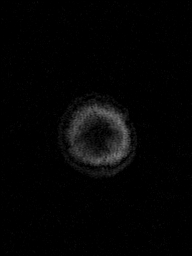
[im 5/105]
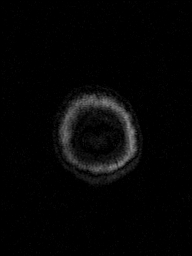
[im 7/105]
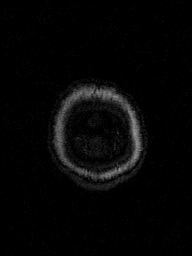
[im 9/105]
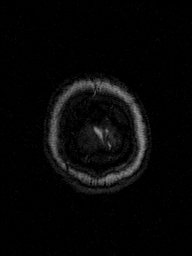
[im 12/105]
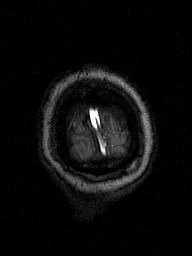
[im 14/105]
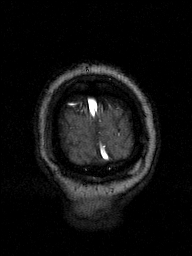
[im 16/105]
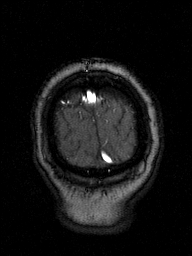
[im 18/105]
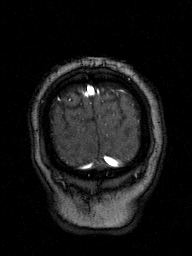
[im 20/105]
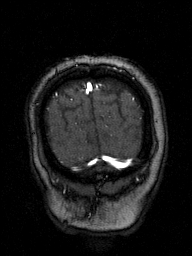
[im 23/105]
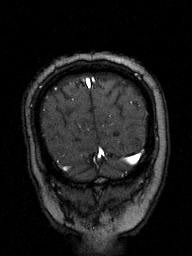
[im 25/105]
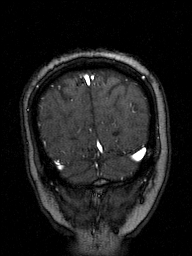
[im 27/105]
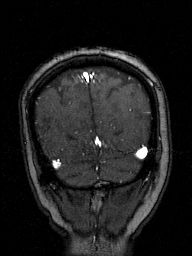
[im 29/105]
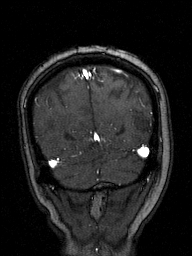
[im 31/105]
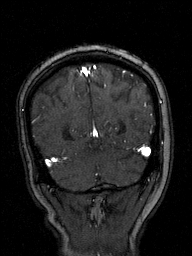
[im 34/105]
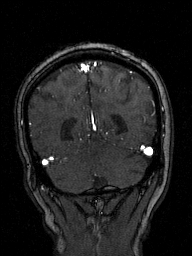
[im 36/105]
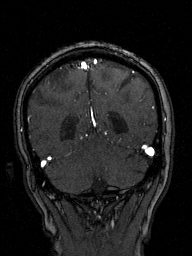
[im 38/105]
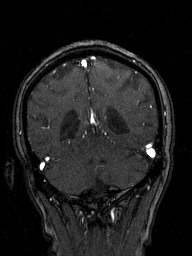
[im 40/105]
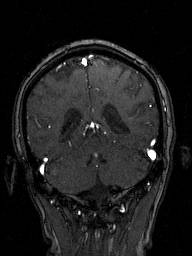
[im 43/105]
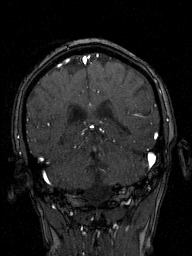
[im 45/105]
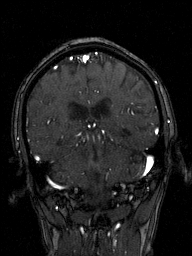
[im 47/105]
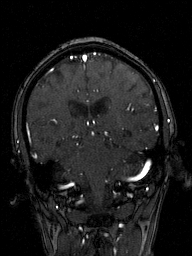
[im 49/105]
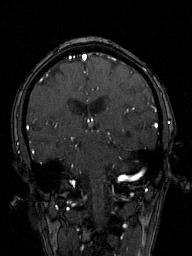
[im 51/105]
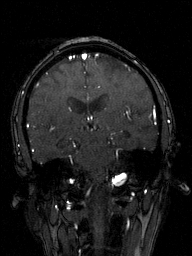
[im 54/105]
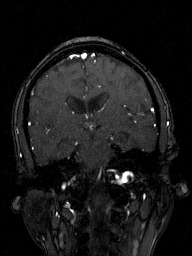
[im 56/105]
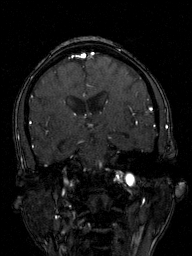
[im 58/105]
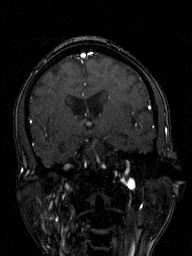
[im 60/105]
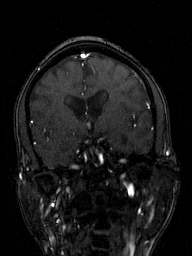
[im 62/105]
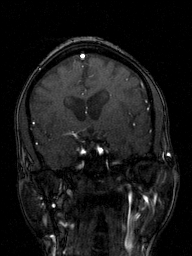
[im 65/105]
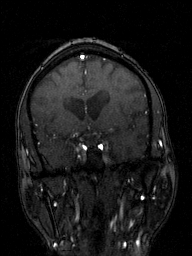
[im 67/105]
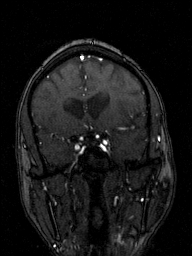
[im 69/105]
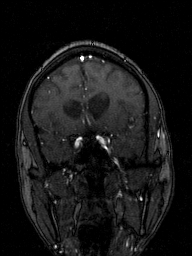
[im 71/105]
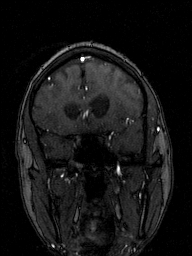
[im 74/105]
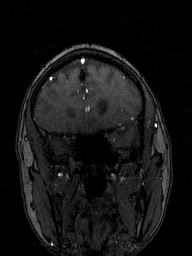
[im 76/105]
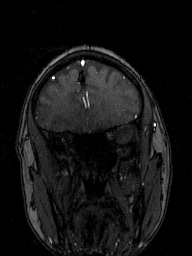
[im 78/105]
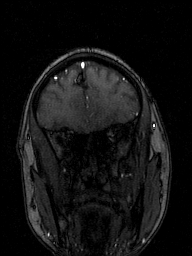
[im 80/105]
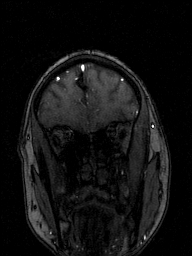
[im 82/105]
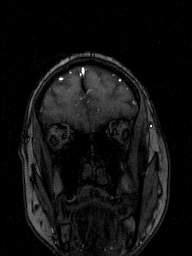
[im 85/105]
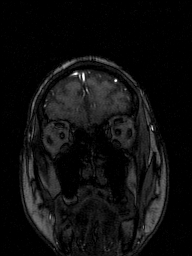
[im 87/105]
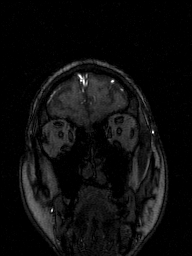
[im 89/105]
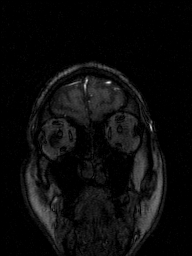
[im 91/105]
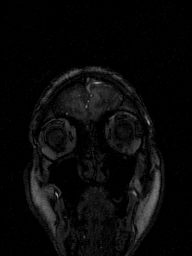
[im 93/105]
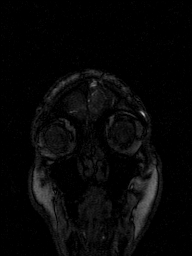
[im 96/105]
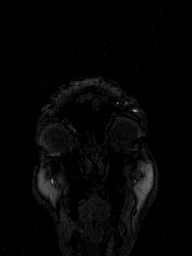
[im 98/105]
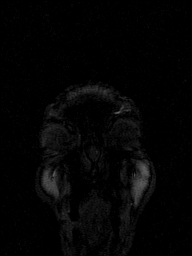
[im 100/105]
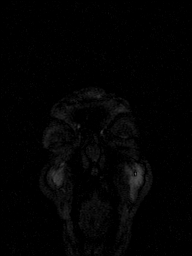
[im 102/105]
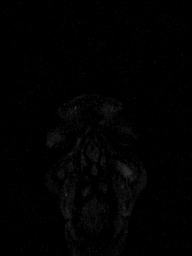
[im 105/105]
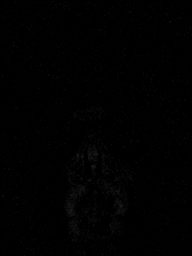

[48 of 48 positions shown; findings below may reference images not displayed]

FINDINGS: Contrast-enhanced MRI was not performed due to renal insufficiency.

Noncontrast MR venogram demonstrates a patent superior sagittal
sinus. Left transverse sinus is dominant and widely patent. There is
a moderate to severe stenosis of the right transverse sinus. Distal
right transverse sinus and sigmoid sinus appear patent. Non dominant
right jugular vein.
IMPRESSION: Moderate to severe stenosis right transverse sinus

Transverse sinus on the left is widely patent. Superior sagittal
sinus widely patent.

The abnormality identified on the prior MRI in the left occipital
lobe does not appear to represent venous thrombosis. Etiology is
uncertain. Follow-up MRI suggested in 6 months.

## 2021-12-31 ENCOUNTER — Encounter: Payer: Self-pay | Admitting: Orthopaedic Surgery

## 2021-12-31 ENCOUNTER — Ambulatory Visit (INDEPENDENT_AMBULATORY_CARE_PROVIDER_SITE_OTHER): Payer: PPO | Admitting: Orthopaedic Surgery

## 2021-12-31 DIAGNOSIS — R29898 Other symptoms and signs involving the musculoskeletal system: Secondary | ICD-10-CM

## 2021-12-31 MED ORDER — PREDNISONE 50 MG PO TABS: ORAL_TABLET | ORAL | 0 refills | Status: AC

## 2021-12-31 NOTE — Addendum Note (Signed)
Addended by: Robyne Peers on: 12/31/2021 04:42 PM   Modules accepted: Orders

## 2021-12-31 NOTE — Progress Notes (Signed)
The patient comes in today with his daughter who I know.  He is 81 years old and has been dealing with right hand pain and weakness for about a month and the pain is waking him up at night.  He really denies any numbness and tingling.  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 Nicoletta Dress at [facility]  This was several years ago.  He says now just over the last month he has not been able to make a fist well and that hand is very weak.  He is 81 years old.  On exam I do not see significant muscle atrophy.  Passively I can make a fist with his hand but actively he has a very hard time doing that.  There is no triggering and no significant pain in his hand seems to be well perfused but it is significantly weak.  I did review notes from [facility] and at 1 point they were considering nerve conduction studies to rule out carpal tunnel syndrome.  He does have pain that is waking him up at night.  This point I would like to put him on 5 days of prednisone since he is not a diabetic.  I would also like to set him up for EMG studies by Dr. Ernestina Patches of the right upper extremity to rule out any type of nerve compression.  We may end up needing to set him up for hand therapy but I would like to try to come up with a diagnosis as well.  He and his daughter agree with this treatment plan.

## 2022-01-20 ENCOUNTER — Telehealth: Payer: Self-pay | Admitting: Physical Medicine and Rehabilitation

## 2022-01-20 NOTE — Telephone Encounter (Signed)
Patient had test already. Daughter called to cxl appointment with Dr. Ernestina Patches.

## 2022-02-02 ENCOUNTER — Encounter: Payer: PPO | Admitting: Physical Medicine and Rehabilitation

## 2022-05-25 ENCOUNTER — Inpatient Hospital Stay
Admission: RE | Admit: 2022-05-25 | Discharge: 2022-06-08 | Disposition: A | Discharge status: Skilled Nursing Facility | Payer: PPO | Attending: Internal Medicine | Admitting: Internal Medicine

## 2022-05-26 ENCOUNTER — Other Ambulatory Visit (HOSPITAL_COMMUNITY): Payer: PPO

## 2022-05-26 LAB — URINALYSIS, ROUTINE W REFLEX MICROSCOPIC
Bilirubin Urine: NEGATIVE
Glucose, UA: NEGATIVE mg/dL
Ketones, ur: 5 mg/dL — AB
Nitrite: NEGATIVE
Protein, ur: 30 mg/dL — AB
RBC / HPF: >50 RBC/hpf — ABNORMAL HIGH (ref 0–5)
Specific Gravity, Urine: 1.015 (ref 1.005–1.030)
pH: 6 (ref 5.0–8.0)

## 2022-05-26 LAB — BASIC METABOLIC PANEL
Anion gap: 8 (ref 5–15)
BUN: 9 mg/dL (ref 8–23)
CO2: 27 mmol/L (ref 22–32)
Calcium: 8.5 mg/dL — ABNORMAL LOW (ref 8.9–10.3)
Chloride: 103 mmol/L (ref 98–111)
Creatinine, Ser: 1.38 mg/dL — ABNORMAL HIGH (ref 0.61–1.24)
GFR, Estimated: 51 mL/min — ABNORMAL LOW (ref >60)
Glucose, Bld: 83 mg/dL (ref 70–99)
Potassium: 3.6 mmol/L (ref 3.5–5.1)
Sodium: 138 mmol/L (ref 135–145)

## 2022-05-26 LAB — CBC
HCT: 22.6 % — ABNORMAL LOW (ref 39.0–52.0)
Hemoglobin: 7.5 g/dL — ABNORMAL LOW (ref 13.0–17.0)
MCH: 29.3 pg (ref 26.0–34.0)
MCHC: 33.2 g/dL (ref 30.0–36.0)
MCV: 88.3 fL (ref 80.0–100.0)
Platelets: 207 10*3/uL (ref 150–400)
RBC: 2.56 MIL/uL — ABNORMAL LOW (ref 4.22–5.81)
RDW: 15.9 % — ABNORMAL HIGH (ref 11.5–15.5)
WBC: 2 10*3/uL — ABNORMAL LOW (ref 4.0–10.5)
nRBC: 0 % (ref 0.0–0.2)

## 2022-05-26 LAB — APTT: aPTT: 35 seconds (ref 24–36)

## 2022-05-26 LAB — PROTIME-INR
INR: 1.2 (ref 0.8–1.2)
Prothrombin Time: 15 seconds (ref 11.4–15.2)

## 2022-05-27 LAB — CBC
HCT: 22.5 % — ABNORMAL LOW (ref 39.0–52.0)
Hemoglobin: 7.5 g/dL — ABNORMAL LOW (ref 13.0–17.0)
MCH: 29.4 pg (ref 26.0–34.0)
MCHC: 33.3 g/dL (ref 30.0–36.0)
MCV: 88.2 fL (ref 80.0–100.0)
Platelets: 190 10*3/uL (ref 150–400)
RBC: 2.55 MIL/uL — ABNORMAL LOW (ref 4.22–5.81)
RDW: 15.8 % — ABNORMAL HIGH (ref 11.5–15.5)
WBC: 2.1 10*3/uL — ABNORMAL LOW (ref 4.0–10.5)
nRBC: 0 % (ref 0.0–0.2)

## 2022-05-27 LAB — HEMOGLOBIN A1C
Hgb A1c MFr Bld: 4.5 % — ABNORMAL LOW (ref 4.8–5.6)
Mean Plasma Glucose: 82.45 mg/dL

## 2022-05-27 LAB — T4, FREE: Free T4: 1.24 ng/dL — ABNORMAL HIGH (ref 0.61–1.12)

## 2022-05-27 LAB — BASIC METABOLIC PANEL
Anion gap: 9 (ref 5–15)
BUN: 9 mg/dL (ref 8–23)
CO2: 29 mmol/L (ref 22–32)
Calcium: 8.6 mg/dL — ABNORMAL LOW (ref 8.9–10.3)
Chloride: 102 mmol/L (ref 98–111)
Creatinine, Ser: 1.4 mg/dL — ABNORMAL HIGH (ref 0.61–1.24)
GFR, Estimated: 50 mL/min — ABNORMAL LOW (ref >60)
Glucose, Bld: 97 mg/dL (ref 70–99)
Potassium: 3.7 mmol/L (ref 3.5–5.1)
Sodium: 140 mmol/L (ref 135–145)

## 2022-05-27 LAB — TSH: TSH: 4.199 u[IU]/mL (ref 0.350–4.500)

## 2022-05-29 ENCOUNTER — Other Ambulatory Visit (HOSPITAL_COMMUNITY): Payer: PPO

## 2022-05-29 LAB — BASIC METABOLIC PANEL
Anion gap: 11 (ref 5–15)
BUN: 19 mg/dL (ref 8–23)
CO2: 29 mmol/L (ref 22–32)
Calcium: 9.2 mg/dL (ref 8.9–10.3)
Chloride: 102 mmol/L (ref 98–111)
Creatinine, Ser: 1.47 mg/dL — ABNORMAL HIGH (ref 0.61–1.24)
GFR, Estimated: 47 mL/min — ABNORMAL LOW (ref >60)
Glucose, Bld: 81 mg/dL (ref 70–99)
Potassium: 4.8 mmol/L (ref 3.5–5.1)
Sodium: 142 mmol/L (ref 135–145)

## 2022-05-29 LAB — CBC
HCT: 25.2 % — ABNORMAL LOW (ref 39.0–52.0)
Hemoglobin: 8.2 g/dL — ABNORMAL LOW (ref 13.0–17.0)
MCH: 29.4 pg (ref 26.0–34.0)
MCHC: 32.5 g/dL (ref 30.0–36.0)
MCV: 90.3 fL (ref 80.0–100.0)
Platelets: 250 10*3/uL (ref 150–400)
RBC: 2.79 MIL/uL — ABNORMAL LOW (ref 4.22–5.81)
RDW: 15.6 % — ABNORMAL HIGH (ref 11.5–15.5)
WBC: 2.3 10*3/uL — ABNORMAL LOW (ref 4.0–10.5)
nRBC: 0 % (ref 0.0–0.2)

## 2022-05-30 LAB — BASIC METABOLIC PANEL
Anion gap: 7 (ref 5–15)
BUN: 22 mg/dL (ref 8–23)
CO2: 30 mmol/L (ref 22–32)
Calcium: 8.5 mg/dL — ABNORMAL LOW (ref 8.9–10.3)
Chloride: 102 mmol/L (ref 98–111)
Creatinine, Ser: 1.47 mg/dL — ABNORMAL HIGH (ref 0.61–1.24)
GFR, Estimated: 47 mL/min — ABNORMAL LOW (ref >60)
Glucose, Bld: 104 mg/dL — ABNORMAL HIGH (ref 70–99)
Potassium: 3.9 mmol/L (ref 3.5–5.1)
Sodium: 139 mmol/L (ref 135–145)

## 2022-05-30 LAB — CBC
HCT: 27.1 % — ABNORMAL LOW (ref 39.0–52.0)
Hemoglobin: 8.3 g/dL — ABNORMAL LOW (ref 13.0–17.0)
MCH: 28.5 pg (ref 26.0–34.0)
MCHC: 30.6 g/dL (ref 30.0–36.0)
MCV: 93.1 fL (ref 80.0–100.0)
Platelets: 269 10*3/uL (ref 150–400)
RBC: 2.91 MIL/uL — ABNORMAL LOW (ref 4.22–5.81)
RDW: 15.6 % — ABNORMAL HIGH (ref 11.5–15.5)
WBC: 3.7 10*3/uL — ABNORMAL LOW (ref 4.0–10.5)
nRBC: 0 % (ref 0.0–0.2)

## 2022-06-02 LAB — CBC
HCT: 25.8 % — ABNORMAL LOW (ref 39.0–52.0)
Hemoglobin: 8.3 g/dL — ABNORMAL LOW (ref 13.0–17.0)
MCH: 29.1 pg (ref 26.0–34.0)
MCHC: 32.2 g/dL (ref 30.0–36.0)
MCV: 90.5 fL (ref 80.0–100.0)
Platelets: 279 10*3/uL (ref 150–400)
RBC: 2.85 MIL/uL — ABNORMAL LOW (ref 4.22–5.81)
RDW: 15.6 % — ABNORMAL HIGH (ref 11.5–15.5)
WBC: 3.3 10*3/uL — ABNORMAL LOW (ref 4.0–10.5)
nRBC: 0 % (ref 0.0–0.2)

## 2022-06-02 LAB — BASIC METABOLIC PANEL
Anion gap: 5 (ref 5–15)
BUN: 23 mg/dL (ref 8–23)
CO2: 28 mmol/L (ref 22–32)
Calcium: 8.7 mg/dL — ABNORMAL LOW (ref 8.9–10.3)
Chloride: 106 mmol/L (ref 98–111)
Creatinine, Ser: 1.47 mg/dL — ABNORMAL HIGH (ref 0.61–1.24)
GFR, Estimated: 47 mL/min — ABNORMAL LOW (ref >60)
Glucose, Bld: 98 mg/dL (ref 70–99)
Potassium: 3.7 mmol/L (ref 3.5–5.1)
Sodium: 139 mmol/L (ref 135–145)

## 2022-06-04 LAB — CBC
HCT: 25.9 % — ABNORMAL LOW (ref 39.0–52.0)
Hemoglobin: 8.4 g/dL — ABNORMAL LOW (ref 13.0–17.0)
MCH: 29.4 pg (ref 26.0–34.0)
MCHC: 32.4 g/dL (ref 30.0–36.0)
MCV: 90.6 fL (ref 80.0–100.0)
Platelets: 299 10*3/uL (ref 150–400)
RBC: 2.86 MIL/uL — ABNORMAL LOW (ref 4.22–5.81)
RDW: 15.7 % — ABNORMAL HIGH (ref 11.5–15.5)
WBC: 2.7 10*3/uL — ABNORMAL LOW (ref 4.0–10.5)
nRBC: 0 % (ref 0.0–0.2)

## 2022-06-04 LAB — COMPREHENSIVE METABOLIC PANEL
ALT: 30 U/L (ref 0–44)
AST: 27 U/L (ref 15–41)
Albumin: 2.9 g/dL — ABNORMAL LOW (ref 3.5–5.0)
Alkaline Phosphatase: 124 U/L (ref 38–126)
Anion gap: 9 (ref 5–15)
BUN: 29 mg/dL — ABNORMAL HIGH (ref 8–23)
CO2: 29 mmol/L (ref 22–32)
Calcium: 9.4 mg/dL (ref 8.9–10.3)
Chloride: 106 mmol/L (ref 98–111)
Creatinine, Ser: 1.58 mg/dL — ABNORMAL HIGH (ref 0.61–1.24)
GFR, Estimated: 43 mL/min — ABNORMAL LOW (ref >60)
Glucose, Bld: 113 mg/dL — ABNORMAL HIGH (ref 70–99)
Potassium: 4.1 mmol/L (ref 3.5–5.1)
Sodium: 144 mmol/L (ref 135–145)
Total Bilirubin: 0.7 mg/dL (ref 0.3–1.2)
Total Protein: 7 g/dL (ref 6.5–8.1)

## 2022-06-04 LAB — AMYLASE: Amylase: 90 U/L (ref 28–100)

## 2022-06-04 LAB — LIPASE, BLOOD: Lipase: 35 U/L (ref 11–51)

## 2022-06-06 LAB — CBC
HCT: 24.4 % — ABNORMAL LOW (ref 39.0–52.0)
Hemoglobin: 7.5 g/dL — ABNORMAL LOW (ref 13.0–17.0)
MCH: 28.6 pg (ref 26.0–34.0)
MCHC: 30.7 g/dL (ref 30.0–36.0)
MCV: 93.1 fL (ref 80.0–100.0)
Platelets: 270 10*3/uL (ref 150–400)
RBC: 2.62 MIL/uL — ABNORMAL LOW (ref 4.22–5.81)
RDW: 15.3 % (ref 11.5–15.5)
WBC: 3.2 10*3/uL — ABNORMAL LOW (ref 4.0–10.5)
nRBC: 0 % (ref 0.0–0.2)

## 2022-06-06 LAB — BASIC METABOLIC PANEL
Anion gap: 7 (ref 5–15)
BUN: 28 mg/dL — ABNORMAL HIGH (ref 8–23)
CO2: 24 mmol/L (ref 22–32)
Calcium: 8.8 mg/dL — ABNORMAL LOW (ref 8.9–10.3)
Chloride: 110 mmol/L (ref 98–111)
Creatinine, Ser: 1.47 mg/dL — ABNORMAL HIGH (ref 0.61–1.24)
GFR, Estimated: 47 mL/min — ABNORMAL LOW (ref >60)
Glucose, Bld: 97 mg/dL (ref 70–99)
Potassium: 4 mmol/L (ref 3.5–5.1)
Sodium: 141 mmol/L (ref 135–145)

## 2022-06-08 ENCOUNTER — Other Ambulatory Visit (HOSPITAL_COMMUNITY): Payer: PPO

## 2022-06-08 LAB — CBC
HCT: 24.1 % — ABNORMAL LOW (ref 39.0–52.0)
Hemoglobin: 7.5 g/dL — ABNORMAL LOW (ref 13.0–17.0)
MCH: 28.7 pg (ref 26.0–34.0)
MCHC: 31.1 g/dL (ref 30.0–36.0)
MCV: 92.3 fL (ref 80.0–100.0)
Platelets: 279 10*3/uL (ref 150–400)
RBC: 2.61 MIL/uL — ABNORMAL LOW (ref 4.22–5.81)
RDW: 15 % (ref 11.5–15.5)
WBC: 3.4 10*3/uL — ABNORMAL LOW (ref 4.0–10.5)
nRBC: 0 % (ref 0.0–0.2)

## 2022-06-08 LAB — BASIC METABOLIC PANEL
Anion gap: 9 (ref 5–15)
BUN: 21 mg/dL (ref 8–23)
CO2: 26 mmol/L (ref 22–32)
Calcium: 9 mg/dL (ref 8.9–10.3)
Chloride: 108 mmol/L (ref 98–111)
Creatinine, Ser: 1.27 mg/dL — ABNORMAL HIGH (ref 0.61–1.24)
GFR, Estimated: 56 mL/min — ABNORMAL LOW (ref >60)
Glucose, Bld: 101 mg/dL — ABNORMAL HIGH (ref 70–99)
Potassium: 4.2 mmol/L (ref 3.5–5.1)
Sodium: 143 mmol/L (ref 135–145)

## 2022-07-12 ENCOUNTER — Emergency Department (HOSPITAL_COMMUNITY): Payer: PPO

## 2022-07-12 ENCOUNTER — Emergency Department (HOSPITAL_COMMUNITY)
Admission: EM | Admit: 2022-07-12 | Discharge: 2022-07-12 | Disposition: A | Discharge status: Rehabilitation Facility | Payer: PPO | Attending: Emergency Medicine | Admitting: Emergency Medicine

## 2022-07-12 DIAGNOSIS — R61 Generalized hyperhidrosis: Secondary | ICD-10-CM | POA: Diagnosis not present

## 2022-07-12 DIAGNOSIS — R42 Dizziness and giddiness: Secondary | ICD-10-CM | POA: Insufficient documentation

## 2022-07-12 DIAGNOSIS — R197 Diarrhea, unspecified: Secondary | ICD-10-CM | POA: Insufficient documentation

## 2022-07-12 DIAGNOSIS — F0390 Unspecified dementia without behavioral disturbance: Secondary | ICD-10-CM | POA: Diagnosis not present

## 2022-07-12 DIAGNOSIS — R109 Unspecified abdominal pain: Secondary | ICD-10-CM | POA: Diagnosis not present

## 2022-07-12 DIAGNOSIS — R55 Syncope and collapse: Secondary | ICD-10-CM

## 2022-07-12 LAB — TROPONIN I (HIGH SENSITIVITY): Troponin I (High Sensitivity): 15 ng/L (ref <18)

## 2022-07-12 LAB — COMPREHENSIVE METABOLIC PANEL
ALT: 20 U/L (ref 0–44)
AST: 24 U/L (ref 15–41)
Albumin: 2.4 g/dL — ABNORMAL LOW (ref 3.5–5.0)
Alkaline Phosphatase: 90 U/L (ref 38–126)
Anion gap: 6 (ref 5–15)
BUN: 14 mg/dL (ref 8–23)
CO2: 26 mmol/L (ref 22–32)
Calcium: 7.5 mg/dL — ABNORMAL LOW (ref 8.9–10.3)
Chloride: 109 mmol/L (ref 98–111)
Creatinine, Ser: 1.43 mg/dL — ABNORMAL HIGH (ref 0.61–1.24)
GFR, Estimated: 49 mL/min — ABNORMAL LOW (ref >60)
Glucose, Bld: 119 mg/dL — ABNORMAL HIGH (ref 70–99)
Potassium: 3.4 mmol/L — ABNORMAL LOW (ref 3.5–5.1)
Sodium: 141 mmol/L (ref 135–145)
Total Bilirubin: 0.9 mg/dL (ref 0.3–1.2)
Total Protein: 6.5 g/dL (ref 6.5–8.1)

## 2022-07-12 LAB — CBC WITH DIFFERENTIAL/PLATELET
Abs Immature Granulocytes: 0.02 10*3/uL (ref 0.00–0.07)
Basophils Absolute: 0 10*3/uL (ref 0.0–0.1)
Basophils Relative: 0 %
Eosinophils Absolute: 0 10*3/uL (ref 0.0–0.5)
Eosinophils Relative: 1 %
HCT: 24.1 % — ABNORMAL LOW (ref 39.0–52.0)
Hemoglobin: 7.4 g/dL — ABNORMAL LOW (ref 13.0–17.0)
Immature Granulocytes: 1 %
Lymphocytes Relative: 23 %
Lymphs Abs: 0.8 10*3/uL (ref 0.7–4.0)
MCH: 26.2 pg (ref 26.0–34.0)
MCHC: 30.7 g/dL (ref 30.0–36.0)
MCV: 85.5 fL (ref 80.0–100.0)
Monocytes Absolute: 0.2 10*3/uL (ref 0.1–1.0)
Monocytes Relative: 6 %
Neutro Abs: 2.5 10*3/uL (ref 1.7–7.7)
Neutrophils Relative %: 69 %
Platelets: 374 10*3/uL (ref 150–400)
RBC: 2.82 MIL/uL — ABNORMAL LOW (ref 4.22–5.81)
RDW: 16 % — ABNORMAL HIGH (ref 11.5–15.5)
WBC: 3.7 10*3/uL — ABNORMAL LOW (ref 4.0–10.5)
nRBC: 0 % (ref 0.0–0.2)

## 2022-07-12 LAB — LIPASE, BLOOD: Lipase: 30 U/L (ref 11–51)

## 2022-07-12 LAB — CBG MONITORING, ED: Glucose-Capillary: 105 mg/dL — ABNORMAL HIGH (ref 70–99)
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
## 2022-07-12 NOTE — ED Triage Notes (Signed)
Pt BIB [facility] d/t [facility] that started 12:45pm today. Pt has dementia, but just a little confused, facility stated he is more confused than normal. Pt had a bowel movement earlier, and when he stood up he started feeling dizzy.   BP128/80 100%  RA HR 60

## 2022-07-12 NOTE — ED Provider Notes (Signed)
[facility] EMERGENCY DEPARTMENT Provider Note   CSN: [id number] Arrival date & time: 07/12/22  1424     History  Chief Complaint  Patient presents with   Abdominal Pain    Jake Clarke is a 81 y.o. male.  He has history of dementia.  He currently resides in a rehab facility.  History was taken from his daughter Mardene .  She states the patient was brought in via EMS from the facility today because while they were visiting patient was sitting on the toilet going to the bathroom when he became very dizzy and was complaining of abdominal pain.  They state he also was pale and sweaty.  Patient does not have any chest pain or shortness of breath, no history of cardiac disease.  In October he had starting kidney stone that was noted with ureteroscopic lithotripsy and had stent placement on October 3 in [facility].  Patient returned to hospital on 05/05/2022 with severe abdominal pain and was found to have small bowel obstruction and had a laparotomy.  And is concerned that he has been having diarrhea since the surgery and that his abdominal pain today could be related to this.  Abdominal Pain      Home Medications Prior to Admission medications   Medication Sig Start Date End Date Taking? Authorizing Provider  amLODipine (NORVASC) 10 MG tablet One tablet by mouth every day for blood pressure control. 02/02/16 02/01/17  Hommel, Hilliard Clark, DO  atorvastatin (LIPITOR) 20 MG tablet TAKE 1 TABLET (20 MG TOTAL) BY MOUTH DAILY. 08/13/15   Marcial Pacas, DO  Calcium Carbonate (CALTRATE 600 PO) Take 600 mg by mouth daily.     [provider]  ferrous sulfate 325 (65 FE) MG EC tablet Take 1 tablet (325 mg total) by mouth daily with breakfast. 08/25/15   Hommel, Sean, DO  finasteride (PROSCAR) 5 MG tablet TAKE 1 TABLET (5 MG TOTAL) BY MOUTH DAILY. 10/09/15   Marcial Pacas, DO  levothyroxine (SYNTHROID, LEVOTHROID) 75 MCG tablet TAKE 1 TABLET BY MOUTH EVERY DAY 03/01/16   Hommel, Sean,  DO  losartan (COZAAR) 50 MG tablet Take 50 mg by mouth. 01/28/16 02/27/16  [provider]  Multiple Vitamins-Minerals (CENTRUM SILVER PO) Take 1 tablet by mouth daily.     [provider]  predniSONE (DELTASONE) 50 MG tablet Take one tablet daily for 5 days. 12/31/21   Mcarthur Rossetti, MD  sildenafil (REVATIO) 20 MG tablet TAKE 1-5 TABLETS BY MOUTH ONCE DAILY AS NEEDED FOR SEXUAL ACTIVITY 08/26/15   Marcial Pacas, DO  tamsulosin (FLOMAX) 0.4 MG CAPS capsule Take 1-2 capsules at bedtime daily as needed for enlarged prostate symptoms and urinary frequency. 02/02/16   Marcial Pacas, DO      Allergies    Patient has no known allergies.    Review of Systems   Review of Systems  Gastrointestinal:  Positive for abdominal pain.    Physical Exam Updated Vital Signs BP (!) 144/71   Pulse 63   Temp 98.7 F (37.1 C) (Oral)   Resp 11   SpO2 100%  Physical Exam Vitals and nursing note reviewed.  Constitutional:      General: He is not in acute distress.    Appearance: He is well-developed.  HENT:     Head: Normocephalic and atraumatic.  Eyes:     Conjunctiva/sclera: Conjunctivae normal.  Cardiovascular:     Rate and Rhythm: Normal rate and regular rhythm.     Heart sounds: No  murmur heard. Pulmonary:     Effort: Pulmonary effort is normal. No respiratory distress.     Breath sounds: Normal breath sounds.  Abdominal:     General: Abdomen is flat. A surgical scar is present. There is no distension.     Palpations: Abdomen is soft.     Tenderness: There is no abdominal tenderness. There is no right CVA tenderness or left CVA tenderness.  Musculoskeletal:        General: No swelling.     Cervical back: Neck supple.  Skin:    General: Skin is warm and dry.     Capillary Refill: Capillary refill takes less than 2 seconds.  Neurological:     Mental Status: He is alert.     Comments: Is oriented to self, family states he is at his mental baseline  Psychiatric:         Mood and Affect: Mood normal.     ED Results / Procedures / Treatments   Labs (all labs ordered are listed, but only abnormal results are displayed) Labs Reviewed  CBC WITH DIFFERENTIAL/PLATELET - Abnormal; Notable for the following components:      Result Value   WBC 3.7 (*)    RBC 2.82 (*)    Hemoglobin 7.4 (*)    HCT 24.1 (*)    RDW 16.0 (*)    All other components within normal limits  COMPREHENSIVE METABOLIC PANEL - Abnormal; Notable for the following components:   Potassium 3.4 (*)    Glucose, Bld 119 (*)    Creatinine, Ser 1.43 (*)    Calcium 7.5 (*)    Albumin 2.4 (*)    GFR, Estimated 49 (*)    All other components within normal limits  CBG MONITORING, ED - Abnormal; Notable for the following components:   Glucose-Capillary 105 (*)    All other components within normal limits  LIPASE, BLOOD  TROPONIN I (HIGH SENSITIVITY)    EKG EKG Interpretation  Date/Time:  Monday July 12 2022 17:45:10 EST Ventricular Rate:  61 PR Interval:  221 QRS Duration: 104 QT Interval:  416 QTC Calculation: 419 R Axis:   71 Text Interpretation: Sinus rhythm Prolonged PR interval Confirmed by Fredia Sorrow [id number]) on 07/12/2022 6:21:38 PM  Radiology DG Chest Portable 1 View  Result Date: 07/12/2022 CLINICAL DATA:  Chest pain.  Dementia. EXAM: PORTABLE CHEST 1 VIEW COMPARISON:  05/26/2022 FINDINGS: Artifact overlies the chest. Heart size is normal. Mediastinal shadows are normal. The lungs are clear. The vascularity is normal. No infiltrate, collapse or effusion. No abnormal bone finding. IMPRESSION: No active disease. Electronically Signed   By: Nelson Chimes M.D.   On: 07/12/2022 16:17    Procedures Procedures    Medications Ordered in ED Medications - No data to display  ED Course/ Medical Decision Making/ A&P Clinical Course as of 07/12/22 1822  Mon Jul 12, 2022  1545 Hemoglobin at baseline  [CB]    Clinical Course User Index [CB] Gwenevere Abbot, PA-C                            Medical Decision Making This patient presents to the ED for concern of dizziness, sweating and abdominal pain, this involves an extensive number of treatment options, and is a complaint that carries with it a high risk of complications and morbidity.  The differential diagnosis includes vasovagal episode, arrhythmia, ACS, abdominal catastrophe, or other   Co morbidities that complicate  the patient evaluation  Dementia   Additional history obtained:  Additional history obtained from his daughter Mardene Moxie Kalil External records from outside source obtained and reviewed including from [facility] for ureteroscopic lithotripsy and laparotomy   Lab Tests:  I Ordered, and personally interpreted labs.  The pertinent results include: Globin relatively stable, very slightly decreased, mild hypokalemia at 3.4.  Troponin is normal   Imaging Studies ordered:  I ordered imaging studies including chest x-ray I independently visualized and interpreted imaging which showed pulmonary edema or pneumonia, no free air I agree with the radiologist interpretation   Cardiac Monitoring: / EKG: EKG shows first-degree AV block, no ST or T wave changes, sinus rhythm   Consultations Obtained:  Patient was also seen by Dr. Rogene Houston  Problem List / ED Course / Critical interventions / Medication management  Near syncope: Does not seem to be cardiac related and, reassuring troponin and ECG, patient is asymptomatic now, he is not hypotensive.  This is likely a vasovagal episode due to discomfort/bowel movement.  Patient has no UTI symptoms, no need to order urinalysis, he does have a stent in place.  He is instructed to follow-up with urology for removal and this is has been in since October.  He had small bowel resection after obstruction, has been having intermittent diarrhea since, instructed to follow-up with GI, his PCP can replace referral at his next appointment after he is  discharged from rehab facility   Reevaluation of the patient after these medicines showed that the patient resolved I have reviewed the patients home medicines and have made adjustments as needed   Social Determinants of Health:  Patient is currently in rehab facility   Test / Admission - Considered:  Considered abdominal CT but patient has very benign exam, do not feel this is indicated at this time     Amount and/or Complexity of Data Reviewed Radiology: ordered.           Final Clinical Impression(s) / ED Diagnoses Final diagnoses:  None    Rx / DC Orders ED Discharge Orders     None         Darci Current 07/12/22 Nicholaus Bloom, MD 07/12/22 [phone]

## 2022-07-12 NOTE — Discharge Instructions (Signed)
You are seen today for a dizzy episode after having some stomach pain and diarrhea.  Your workup was very reassuring.  You will need to see a stomach doctor after discharge from rehab.  If you have any persistent abdominal pain, or develop vomiting fever or worsening symptoms, have persistent dizziness or passing out he need to come back to the emergency department.

## 2022-07-12 NOTE — ED Notes (Signed)
Discharge instructions reviewed with patient and with family. Attempted to recall report to [facility] [place] with no response, voicemail left. Patient out to lobby via wheelchair. Patient and family denied any questions or concerns.
